# Patient Record
Sex: Male | Born: 1965 | Race: White | Hispanic: No | Marital: Married | Smoking: Never smoker
Health system: Southern US, Community
[De-identification: ages and names within clinical notes are randomized; demographics above are authoritative.]

## PROBLEM LIST (undated history)

## (undated) DIAGNOSIS — K219 Gastro-esophageal reflux disease without esophagitis: Secondary | ICD-10-CM

## (undated) DIAGNOSIS — I10 Essential (primary) hypertension: Secondary | ICD-10-CM

## (undated) HISTORY — PX: SCAR REVISION: SHX5285

## (undated) HISTORY — DX: Essential (primary) hypertension: I10

## (undated) HISTORY — DX: Gastro-esophageal reflux disease without esophagitis: K21.9

---

## 2012-06-25 ENCOUNTER — Encounter: Payer: Self-pay | Admitting: Physician Assistant

## 2012-06-25 ENCOUNTER — Ambulatory Visit (INDEPENDENT_AMBULATORY_CARE_PROVIDER_SITE_OTHER): Payer: BC Managed Care – PPO | Admitting: Physician Assistant

## 2012-06-25 VITALS — BP 129/84 | HR 82 | Temp 97.4°F | Wt 192.0 lb

## 2012-06-25 DIAGNOSIS — J329 Chronic sinusitis, unspecified: Secondary | ICD-10-CM

## 2012-06-25 DIAGNOSIS — I10 Essential (primary) hypertension: Secondary | ICD-10-CM

## 2012-06-25 MED ORDER — LORATADINE-PSEUDOEPHEDRINE ER 10-240 MG PO TB24: 1.0000 | ORAL_TABLET | Freq: Every day | ORAL | Status: DC

## 2012-06-25 MED ORDER — AMOXICILLIN-POT CLAVULANATE 875-125 MG PO TABS: 1.0000 | ORAL_TABLET | Freq: Two times a day (BID) | ORAL | Status: DC

## 2012-06-25 NOTE — Patient Instructions (Addendum)

## 2012-06-25 NOTE — Progress Notes (Signed)
Subjective:     Patient ID: Larry Patel, male   DOB: 01/16/1965, 47 y.o.   MRN: [id number]  HPI Pt with sinus pressure/congestion, cough, S/T, and general malaise for ~ 10 days Denies definite fever/chills Cough worse with laying He has used OTC meds with minimal relief  Review of Systems  All other systems reviewed and are negative.       Objective:   Physical Exam  Nursing note and vitals reviewed. ears- canals nl, TM's with fluid bilat Oral- thick PND, no  drainage or increase in tonsil size, no exudate No cerv nodes Heart- RRR w/o M Lungs- coarse sounds bilat that clear well with cough + maxillary and frontal sinus TTP     Assessment:     Sinusitis    Plan:     OTC Claritin D Fluids  Rest Augmentin 875mg  1 po bid with food x 10 days F/U prn

## 2015-02-22 DIAGNOSIS — K219 Gastro-esophageal reflux disease without esophagitis: Secondary | ICD-10-CM | POA: Insufficient documentation

## 2015-07-27 ENCOUNTER — Telehealth: Payer: Self-pay | Admitting: Family Medicine

## 2015-07-27 NOTE — Telephone Encounter (Signed)
Pt given appt with Dr.Bradshaw tomorrow at 3:10 and is aware to arrive 30 minutes prior.

## 2015-07-28 ENCOUNTER — Encounter: Payer: Self-pay | Admitting: Family Medicine

## 2015-07-28 ENCOUNTER — Encounter (INDEPENDENT_AMBULATORY_CARE_PROVIDER_SITE_OTHER): Payer: Self-pay

## 2015-07-28 ENCOUNTER — Telehealth: Payer: Self-pay | Admitting: Family Medicine

## 2015-07-28 ENCOUNTER — Other Ambulatory Visit: Payer: Self-pay | Admitting: *Deleted

## 2015-07-28 ENCOUNTER — Ambulatory Visit (INDEPENDENT_AMBULATORY_CARE_PROVIDER_SITE_OTHER): Payer: BLUE CROSS/BLUE SHIELD | Admitting: Family Medicine

## 2015-07-28 VITALS — BP 141/83 | HR 75 | Temp 97.2°F | Ht 69.0 in | Wt 204.8 lb

## 2015-07-28 DIAGNOSIS — I10 Essential (primary) hypertension: Secondary | ICD-10-CM | POA: Diagnosis not present

## 2015-07-28 DIAGNOSIS — K219 Gastro-esophageal reflux disease without esophagitis: Secondary | ICD-10-CM

## 2015-07-28 MED ORDER — OMEPRAZOLE 40 MG PO CPDR: 40.0000 mg | DELAYED_RELEASE_CAPSULE | Freq: Every day | ORAL | 3 refills | Status: DC

## 2015-07-28 MED ORDER — VALSARTAN 320 MG PO TABS: 320.0000 mg | ORAL_TABLET | Freq: Every day | ORAL | 1 refills | Status: DC

## 2015-07-28 MED ORDER — VALSARTAN 320 MG PO TABS: 160.0000 mg | ORAL_TABLET | Freq: Every day | ORAL | 1 refills | Status: DC

## 2015-07-28 NOTE — Patient Instructions (Signed)
Great to meet you!  Lets plan on seeing you once a year unless you need [place] sooner.   We will call with labs or send them to mychart within 1 week

## 2015-07-28 NOTE — Telephone Encounter (Signed)
Pt takes valsartan 320 mg 0.5 tablets daily. Corrected Rx sent to [facility]

## 2015-07-28 NOTE — Progress Notes (Signed)
   HPI  Patient presents today here to establish care and discuss hypertension.  Patient is previously going to [facility] family practice, he states that he would like to transition here because his wife comes to see me as well.  Hypertension He states he takes 160 mg valsartan every day He takes half a tablet of 320 No headache, chest pain, dyspnea, palpitations, leg edema. Good medication compliance. Blood pressure is typically 130/80 at home. He has been out of medication for 1-1/2 weeks.  He exercises running 3-4 miles 3 or more times a week. He watches his diet moderately, his wife has diabetes.  PMH: Smoking status noted Past medical history significant for hypertension Family history of diabetes in a maternal aunt, hypertension in father Only skin surgery Former smoker, quit in 1998 Approximate 2 drinks per week. ROS: Per HPI  Objective: BP (!) 141/83   Pulse 75   Temp 97.2 F (36.2 C) (Oral)   Ht '5\' 9"'$  (1.753 m)   Wt 204 lb 12.8 oz (92.9 kg)   BMI 30.24 kg/m  Gen: NAD, alert, cooperative with exam HEENT: NCAT, EOMI, PERRL CV: RRR, good S1/S2, no murmur Resp: CTABL, no wheezes, non-labored Abd: SNTND, BS present, no guarding or organomegaly Ext: No edema, warm Neuro: Alert and oriented, strength 5/5 and sensation intact in all 4 extremities, 2+ patellar tendon reflexes bilaterally  Assessment and plan:  # Hypertension He's been out of medication for 1-1/2 weeks, elevated today. Reports good control at home. Discussed therapeutic lifestyle changes encouraged his exercise habits and improving his diet.  He is not fasting today, labs  Obesity discussed, he is very muscular, more likely overweight  Gerd- refill PPI    Orders Placed This Encounter  Procedures  . CMP14+EGFR  . CBC with Differential  . Lipid Panel  . TSH    Meds ordered this encounter  Medications  . DISCONTD: valsartan (DIOVAN) 320 MG tablet    Sig: Take 1 tablet by mouth daily.  Take 1/2 tablet daily  . DISCONTD: omeprazole (PRILOSEC) 40 MG capsule    Sig: Take 40 mg by mouth daily.  . valsartan (DIOVAN) 320 MG tablet    Sig: Take 1 tablet (320 mg total) by mouth daily. Take 1/2 tablet daily    Dispense:  90 tablet    Refill:  1  . omeprazole (PRILOSEC) 40 MG capsule    Sig: Take 1 capsule (40 mg total) by mouth daily.    Dispense:  90 capsule    Refill:  [place], MD [facility] 07/28/2015, 3:24 PM

## 2015-07-29 LAB — CMP14+EGFR
A/G RATIO: 1.6 (ref 1.2–2.2)
ALBUMIN: 4.4 g/dL (ref 3.5–5.5)
ALK PHOS: 67 IU/L (ref 39–117)
ALT: 22 IU/L (ref 0–44)
AST: 21 IU/L (ref 0–40)
BILIRUBIN TOTAL: 0.2 mg/dL (ref 0.0–1.2)
BUN / CREAT RATIO: 20 (ref 9–20)
BUN: 16 mg/dL (ref 6–24)
CHLORIDE: 102 mmol/L (ref 96–106)
CO2: 23 mmol/L (ref 18–29)
Calcium: 8.9 mg/dL (ref 8.7–10.2)
Creatinine, Ser: 0.8 mg/dL (ref 0.76–1.27)
GFR calc non Af Amer: 106 mL/min/{1.73_m2} (ref >59)
GFR, EST AFRICAN AMERICAN: 123 mL/min/{1.73_m2} (ref >59)
GLUCOSE: 117 mg/dL — AB (ref 65–99)
Globulin, Total: 2.7 g/dL (ref 1.5–4.5)
POTASSIUM: 4.3 mmol/L (ref 3.5–5.2)
Sodium: 140 mmol/L (ref 134–144)
TOTAL PROTEIN: 7.1 g/dL (ref 6.0–8.5)

## 2015-07-29 LAB — CBC WITH DIFFERENTIAL/PLATELET
BASOS ABS: 0 10*3/uL (ref 0.0–0.2)
Basos: 0 %
EOS (ABSOLUTE): 0.2 10*3/uL (ref 0.0–0.4)
Eos: 2 %
Hematocrit: 41.9 % (ref 37.5–51.0)
Hemoglobin: 14.5 g/dL (ref 12.6–17.7)
Immature Grans (Abs): 0 10*3/uL (ref 0.0–0.1)
Immature Granulocytes: 0 %
LYMPHS ABS: 2.2 10*3/uL (ref 0.7–3.1)
Lymphs: 32 %
MCH: 32.1 pg (ref 26.6–33.0)
MCHC: 34.6 g/dL (ref 31.5–35.7)
MCV: 93 fL (ref 79–97)
Monocytes Absolute: 0.4 10*3/uL (ref 0.1–0.9)
Monocytes: 6 %
NEUTROS ABS: 4.1 10*3/uL (ref 1.4–7.0)
Neutrophils: 60 %
PLATELETS: 211 10*3/uL (ref 150–379)
RBC: 4.52 x10E6/uL (ref 4.14–5.80)
RDW: 14 % (ref 12.3–15.4)
WBC: 6.8 10*3/uL (ref 3.4–10.8)

## 2015-07-29 LAB — LIPID PANEL
CHOLESTEROL TOTAL: 179 mg/dL (ref 100–199)
Chol/HDL Ratio: 2.6 ratio units (ref 0.0–5.0)
HDL: 70 mg/dL (ref >39)
LDL Calculated: 74 mg/dL (ref 0–99)
Triglycerides: 177 mg/dL — ABNORMAL HIGH (ref 0–149)
VLDL Cholesterol Cal: 35 mg/dL (ref 5–40)

## 2015-07-29 LAB — TSH: TSH: 2.29 u[IU]/mL (ref 0.450–4.500)

## 2015-08-03 ENCOUNTER — Telehealth: Payer: Self-pay | Admitting: Family Medicine

## 2015-08-03 NOTE — Telephone Encounter (Signed)
Left detailed message per patients request about lab results.

## 2016-07-19 ENCOUNTER — Other Ambulatory Visit: Payer: Self-pay | Admitting: Family Medicine

## 2016-07-24 ENCOUNTER — Other Ambulatory Visit: Payer: Self-pay | Admitting: Family Medicine

## 2016-07-31 ENCOUNTER — Other Ambulatory Visit: Payer: Self-pay

## 2016-07-31 MED ORDER — OMEPRAZOLE 40 MG PO CPDR: 40.0000 mg | DELAYED_RELEASE_CAPSULE | Freq: Every day | ORAL | 0 refills | Status: DC

## 2016-07-31 MED ORDER — OLMESARTAN MEDOXOMIL 40 MG PO TABS: 40.0000 mg | ORAL_TABLET | Freq: Every day | ORAL | 0 refills | Status: DC

## 2016-07-31 NOTE — Telephone Encounter (Signed)
Last seen 07/28/15- please advise refill.

## 2016-07-31 NOTE — Telephone Encounter (Signed)
Lmtcb/ww 07/31

## 2016-07-31 NOTE — Telephone Encounter (Signed)
Change valsartan to olmesartan due to recall, needs follow up   Has had labs at cardiology recently.   Murtis SinkSam Bradshaw, MD [facility] 07/31/2016, 1:48 PM

## 2016-08-04 NOTE — Telephone Encounter (Signed)
Patient aware of medication change and that he is time for a follow up

## 2016-11-02 ENCOUNTER — Other Ambulatory Visit: Payer: Self-pay | Admitting: Family Medicine

## 2016-11-05 ENCOUNTER — Encounter: Payer: Self-pay | Admitting: Family Medicine

## 2016-11-05 ENCOUNTER — Ambulatory Visit (INDEPENDENT_AMBULATORY_CARE_PROVIDER_SITE_OTHER): Payer: BLUE CROSS/BLUE SHIELD | Admitting: Family Medicine

## 2016-11-05 VITALS — BP 137/81 | HR 83 | Temp 97.4°F | Ht 69.0 in | Wt 205.0 lb

## 2016-11-05 DIAGNOSIS — Z23 Encounter for immunization: Secondary | ICD-10-CM

## 2016-11-05 DIAGNOSIS — K219 Gastro-esophageal reflux disease without esophagitis: Secondary | ICD-10-CM

## 2016-11-05 DIAGNOSIS — I10 Essential (primary) hypertension: Secondary | ICD-10-CM

## 2016-11-05 DIAGNOSIS — Z0000 Encounter for general adult medical examination without abnormal findings: Secondary | ICD-10-CM | POA: Diagnosis not present

## 2016-11-05 MED ORDER — OLMESARTAN MEDOXOMIL 40 MG PO TABS: 40.0000 mg | ORAL_TABLET | Freq: Every day | ORAL | 3 refills | Status: DC

## 2016-11-05 MED ORDER — OMEPRAZOLE 40 MG PO CPDR: 40.0000 mg | DELAYED_RELEASE_CAPSULE | Freq: Every day | ORAL | 3 refills | Status: DC

## 2016-11-05 NOTE — Progress Notes (Signed)
   HPI  Patient presents today for an annual physical exam and for routine follow-up.  Hypertension Patient changed from valsartan to losartan whenever the recall happened.  He stated he had about a week of lightheadedness and then improved.  He has had no other side effects. He is tolerating medication well with good medication compliance currently. Blood pressure at home has been generally 130s when checked. No chest pain or headaches.  GERD Daily symptoms without medication, needs refill of PPI.  He would like a flu shot today  PMH: Smoking status noted ROS: Per HPI  Objective: BP 137/81   Pulse 83   Temp (!) 97.4 F (36.3 C) (Oral)   Ht _0  (1.753 m)   Wt 205 lb (93 kg)   BMI 30.27 kg/m  Gen: NAD, alert, cooperative with exam HEENT: NCAT, EOMI, PERRL CV: RRR, good S1/S2, no murmur Resp: CTABL, no wheezes, non-labored Abd: SNTND, BS present, no guarding or organomegaly Ext: No edema, warm Neuro: Alert and oriented, No gross deficits  Assessment and plan:  #Annual physical exam Normal exam Discussed prostate health and prostate issues. Discussed colonoscopy next year. Influenza vaccine given  #Hypertension Well-controlled on losartan, refill Labs  #GERD Refill PPI, well controlled   Orders Placed This Encounter  Procedures  . PSA  . CBC with Differential/Platelet  . CMP14+EGFR  . Lipid panel    Meds ordered this encounter  Medications  . olmesartan (BENICAR) 40 MG tablet    Sig: Take 1 tablet (40 mg total) daily by mouth.    Dispense:  90 tablet    Refill:  3  . omeprazole (PRILOSEC) 40 MG capsule    Sig: Take 1 capsule (40 mg total) daily by mouth.    Dispense:  90 capsule    Refill:  [place], MD [facility] 11/05/2016, 11:06 AM

## 2016-11-05 NOTE — Patient Instructions (Signed)
Great to see you!   Health Maintenance, Male A healthy lifestyle and preventive care is important for your health and wellness. Ask your health care provider about what schedule of regular examinations is right for you. What should I know about weight and diet? Eat a Healthy Diet  Eat plenty of vegetables, fruits, whole grains, low-fat dairy products, and lean protein.  Do not eat a lot of foods high in solid fats, added sugars, or salt.  Maintain a Healthy Weight Regular exercise can help you achieve or maintain a healthy weight. You should:  Do at least 150 minutes of exercise each week. The exercise should increase your heart rate and make you sweat (moderate-intensity exercise).  Do strength-training exercises at least twice a week.  Watch Your Levels of Cholesterol and Blood Lipids  Have your blood tested for lipids and cholesterol every 5 years starting at 49 years of age. If you are at high risk for heart disease, you should start having your blood tested when you are 49 years old. You may need to have your cholesterol levels checked more often if: ? Your lipid or cholesterol levels are high. ? You are older than 50 years of age. ? You are at high risk for heart disease.  What should I know about cancer screening? Many types of cancers can be detected early and may often be prevented. Lung Cancer  You should be screened every year for lung cancer if: ? You are a current smoker who has smoked for at least 30 years. ? You are a former smoker who has quit within the past 15 years.  Talk to your health care provider about your screening options, when you should start screening, and how often you should be screened.  Colorectal Cancer  Routine colorectal cancer screening usually begins at 50 years of age and should be repeated every 5-10 years until you are 49 years old. You may need to be screened more often if early forms of precancerous polyps or small growths are found.  Your health care provider may recommend screening at an earlier age if you have risk factors for colon cancer.  Your health care provider may recommend using home test kits to check for hidden blood in the stool.  A small camera at the end of a tube can be used to examine your colon (sigmoidoscopy or colonoscopy). This checks for the earliest forms of colorectal cancer.  Prostate and Testicular Cancer  Depending on your age and overall health, your health care provider may do certain tests to screen for prostate and testicular cancer.  Talk to your health care provider about any symptoms or concerns you have about testicular or prostate cancer.  Skin Cancer  Check your skin from head to toe regularly.  Tell your health care provider about any new moles or changes in moles, especially if: ? There is a change in a mole's size, shape, or color. ? You have a mole that is larger than a pencil eraser.  Always use sunscreen. Apply sunscreen liberally and repeat throughout the day.  Protect yourself by wearing long sleeves, pants, a wide-brimmed hat, and sunglasses when outside.  What should I know about heart disease, diabetes, and high blood pressure?  If you are 18-39 years of age, have your blood pressure checked every 3-5 years. If you are 40 years of age or older, have your blood pressure checked every year. You should have your blood pressure measured twice-once when you are at a   hospital or clinic, and once when you are not at a hospital or clinic. Record the average of the two measurements. To check your blood pressure when you are not at a hospital or clinic, you can use: ? An automated blood pressure machine at a pharmacy. ? A home blood pressure monitor.  Talk to your health care provider about your target blood pressure.  If you are between 45-79 years old, ask your health care provider if you should take aspirin to prevent heart disease.  Have regular diabetes screenings by  checking your fasting blood sugar level. ? If you are at a normal weight and have a low risk for diabetes, have this test once every three years after the age of 45. ? If you are overweight and have a high risk for diabetes, consider being tested at a younger age or more often.  A one-time screening for abdominal aortic aneurysm (AAA) by ultrasound is recommended for men aged 65-75 years who are current or former smokers. What should I know about preventing infection? Hepatitis B If you have a higher risk for hepatitis B, you should be screened for this virus. Talk with your health care provider to find out if you are at risk for hepatitis B infection. Hepatitis C Blood testing is recommended for:  Everyone born from 1945 through 1965.  Anyone with known risk factors for hepatitis C.  Sexually Transmitted Diseases (STDs)  You should be screened each year for STDs including gonorrhea and chlamydia if: ? You are sexually active and are younger than 49 years of age. ? You are older than 49 years of age and your health care provider tells you that you are at risk for this type of infection. ? Your sexual activity has changed since you were last screened and you are at an increased risk for chlamydia or gonorrhea. Ask your health care provider if you are at risk.  Talk with your health care provider about whether you are at high risk of being infected with HIV. Your health care provider may recommend a prescription medicine to help prevent HIV infection.  What else can I do?  Schedule regular health, dental, and eye exams.  Stay current with your vaccines (immunizations).  Do not use any tobacco products, such as cigarettes, chewing tobacco, and e-cigarettes. If you need help quitting, ask your health care provider.  Limit alcohol intake to no more than 2 drinks per day. One drink equals 12 ounces of beer, 5 ounces of wine, or 1 ounces of hard liquor.  Do not use street drugs.  Do not  share needles.  Ask your health care provider for help if you need support or information about quitting drugs.  Tell your health care provider if you often feel depressed.  Tell your health care provider if you have ever been abused or do not feel safe at home. This information is not intended to replace advice given to you by your health care provider. Make sure you discuss any questions you have with your health care provider. Document Released: 06/16/2007 Document Revised: 08/17/2015 Document Reviewed: 09/21/2014 Elsevier Interactive Patient Education  2018 [employer]  

## 2016-11-06 ENCOUNTER — Encounter: Payer: Self-pay | Admitting: Family Medicine

## 2016-11-06 LAB — CMP14+EGFR
A/G RATIO: 1.4 (ref 1.2–2.2)
ALK PHOS: 63 IU/L (ref 39–117)
ALT: 16 IU/L (ref 0–44)
AST: 12 IU/L (ref 0–40)
Albumin: 4.3 g/dL (ref 3.5–5.5)
BUN/Creatinine Ratio: 11 (ref 9–20)
BUN: 11 mg/dL (ref 6–24)
Bilirubin Total: 0.4 mg/dL (ref 0.0–1.2)
CALCIUM: 9.4 mg/dL (ref 8.7–10.2)
CO2: 25 mmol/L (ref 20–29)
Chloride: 102 mmol/L (ref 96–106)
Creatinine, Ser: 1.02 mg/dL (ref 0.76–1.27)
GFR calc Af Amer: 99 mL/min/{1.73_m2} (ref >59)
GFR, EST NON AFRICAN AMERICAN: 86 mL/min/{1.73_m2} (ref >59)
GLOBULIN, TOTAL: 3 g/dL (ref 1.5–4.5)
Glucose: 92 mg/dL (ref 65–99)
POTASSIUM: 4.9 mmol/L (ref 3.5–5.2)
SODIUM: 141 mmol/L (ref 134–144)
Total Protein: 7.3 g/dL (ref 6.0–8.5)

## 2016-11-06 LAB — LIPID PANEL
Chol/HDL Ratio: 2.5 ratio (ref 0.0–5.0)
Cholesterol, Total: 183 mg/dL (ref 100–199)
HDL: 73 mg/dL (ref >39)
LDL Calculated: 98 mg/dL (ref 0–99)
TRIGLYCERIDES: 62 mg/dL (ref 0–149)
VLDL Cholesterol Cal: 12 mg/dL (ref 5–40)

## 2016-11-06 LAB — CBC WITH DIFFERENTIAL/PLATELET
BASOS: 0 %
Basophils Absolute: 0 10*3/uL (ref 0.0–0.2)
EOS (ABSOLUTE): 0.2 10*3/uL (ref 0.0–0.4)
EOS: 3 %
HEMATOCRIT: 42.1 % (ref 37.5–51.0)
Hemoglobin: 14.4 g/dL (ref 13.0–17.7)
Immature Grans (Abs): 0 10*3/uL (ref 0.0–0.1)
Immature Granulocytes: 0 %
LYMPHS ABS: 2.1 10*3/uL (ref 0.7–3.1)
Lymphs: 33 %
MCH: 31.9 pg (ref 26.6–33.0)
MCHC: 34.2 g/dL (ref 31.5–35.7)
MCV: 93 fL (ref 79–97)
MONOS ABS: 0.4 10*3/uL (ref 0.1–0.9)
Monocytes: 6 %
Neutrophils Absolute: 3.7 10*3/uL (ref 1.4–7.0)
Neutrophils: 58 %
Platelets: 214 10*3/uL (ref 150–379)
RBC: 4.52 x10E6/uL (ref 4.14–5.80)
RDW: 14 % (ref 12.3–15.4)
WBC: 6.4 10*3/uL (ref 3.4–10.8)

## 2016-11-06 LAB — PSA: Prostate Specific Ag, Serum: 0.5 ng/mL (ref 0.0–4.0)

## 2017-01-31 ENCOUNTER — Other Ambulatory Visit: Payer: Self-pay | Admitting: Family Medicine

## 2017-02-03 ENCOUNTER — Other Ambulatory Visit: Payer: Self-pay | Admitting: Family Medicine

## 2017-04-29 ENCOUNTER — Other Ambulatory Visit: Payer: Self-pay | Admitting: Family Medicine

## 2017-04-29 NOTE — Telephone Encounter (Signed)
Last seen 11/05/16

## 2017-05-06 ENCOUNTER — Other Ambulatory Visit: Payer: Self-pay | Admitting: Family Medicine

## 2017-07-18 ENCOUNTER — Other Ambulatory Visit: Payer: Self-pay | Admitting: Family Medicine

## 2017-07-18 NOTE — Telephone Encounter (Signed)
Last seen 11/05/16

## 2017-07-22 ENCOUNTER — Other Ambulatory Visit: Payer: Self-pay | Admitting: Family Medicine

## 2017-07-23 NOTE — Telephone Encounter (Signed)
Last seen 11/05/16

## 2017-10-17 ENCOUNTER — Other Ambulatory Visit: Payer: Self-pay | Admitting: *Deleted

## 2017-10-17 MED ORDER — OLMESARTAN MEDOXOMIL 40 MG PO TABS: 40.0000 mg | ORAL_TABLET | Freq: Every day | ORAL | 0 refills | Status: DC

## 2017-11-05 ENCOUNTER — Ambulatory Visit: Payer: BLUE CROSS/BLUE SHIELD | Admitting: Family Medicine

## 2017-11-05 ENCOUNTER — Encounter: Payer: Self-pay | Admitting: Family Medicine

## 2017-11-05 VITALS — BP 146/94 | HR 76 | Temp 97.0°F | Ht 69.0 in | Wt 208.4 lb

## 2017-11-05 DIAGNOSIS — K219 Gastro-esophageal reflux disease without esophagitis: Secondary | ICD-10-CM

## 2017-11-05 DIAGNOSIS — Z6830 Body mass index (BMI) 30.0-30.9, adult: Secondary | ICD-10-CM

## 2017-11-05 DIAGNOSIS — Z23 Encounter for immunization: Secondary | ICD-10-CM

## 2017-11-05 DIAGNOSIS — Z0000 Encounter for general adult medical examination without abnormal findings: Secondary | ICD-10-CM | POA: Diagnosis not present

## 2017-11-05 DIAGNOSIS — I10 Essential (primary) hypertension: Secondary | ICD-10-CM

## 2017-11-05 DIAGNOSIS — Z1211 Encounter for screening for malignant neoplasm of colon: Secondary | ICD-10-CM

## 2017-11-05 MED ORDER — OMEPRAZOLE 40 MG PO CPDR: 40.0000 mg | DELAYED_RELEASE_CAPSULE | Freq: Every day | ORAL | 3 refills | Status: DC

## 2017-11-05 MED ORDER — OLMESARTAN MEDOXOMIL 40 MG PO TABS: 40.0000 mg | ORAL_TABLET | Freq: Every day | ORAL | 0 refills | Status: DC

## 2017-11-05 MED ORDER — OLMESARTAN MEDOXOMIL 40 MG PO TABS: 40.0000 mg | ORAL_TABLET | Freq: Every day | ORAL | 6 refills | Status: DC

## 2017-11-05 MED ORDER — OMEPRAZOLE 40 MG PO CPDR: 40.0000 mg | DELAYED_RELEASE_CAPSULE | Freq: Every day | ORAL | 0 refills | Status: DC

## 2017-11-05 NOTE — Addendum Note (Signed)
Addended by: Sonny Masters on: 11/05/2017 05:35 PM   Modules accepted: Orders

## 2017-11-05 NOTE — Patient Instructions (Signed)
DASH Eating Plan DASH stands for "Dietary Approaches to Stop Hypertension." The DASH eating plan is a healthy eating plan that has been shown to reduce high blood pressure (hypertension). It may also reduce your risk for type 2 diabetes, heart disease, and stroke. The DASH eating plan may also help with weight loss. What are tips for following this plan? General guidelines  Avoid eating more than 2,300 mg (milligrams) of salt (sodium) a day. If you have hypertension, you may need to reduce your sodium intake to 1,500 mg a day.  Limit alcohol intake to no more than 1 drink a day for nonpregnant women and 2 drinks a day for men. One drink equals 12 oz of beer, 5 oz of wine, or 1 oz of hard liquor.  Work with your health care provider to maintain a healthy body weight or to lose weight. Ask what an ideal weight is for you.  Get at least 30 minutes of exercise that causes your heart to beat faster (aerobic exercise) most days of the week. Activities may include walking, swimming, or biking.  Work with your health care provider or diet and nutrition specialist (dietitian) to adjust your eating plan to your individual calorie needs. Reading food labels  Check food labels for the amount of sodium per serving. Choose foods with less than 5 percent of the Daily Value of sodium. Generally, foods with less than 300 mg of sodium per serving fit into this eating plan.  To find whole grains, look for the word "whole" as the first word in the ingredient list. Shopping  Buy products labeled as "low-sodium" or "no salt added."  Buy fresh foods. Avoid canned foods and premade or frozen meals. Cooking  Avoid adding salt when cooking. Use salt-free seasonings or herbs instead of table salt or sea salt. Check with your health care provider or pharmacist before using salt substitutes.  Do not fry foods. Cook foods using healthy methods such as baking, boiling, grilling, and broiling instead.  Cook with  heart-healthy oils, such as olive, canola, soybean, or sunflower oil. Meal planning   Eat a balanced diet that includes: ? 5 or more servings of fruits and vegetables each day. At each meal, try to fill half of your plate with fruits and vegetables. ? Up to 6-8 servings of whole grains each day. ? Less than 6 oz of lean meat, poultry, or fish each day. A 3-oz serving of meat is about the same size as a deck of cards. One egg equals 1 oz. ? 2 servings of low-fat dairy each day. ? A serving of nuts, seeds, or beans 5 times each week. ? Heart-healthy fats. Healthy fats called Omega-3 fatty acids are found in foods such as flaxseeds and coldwater fish, like sardines, salmon, and mackerel.  Limit how much you eat of the following: ? Canned or prepackaged foods. ? Food that is high in trans fat, such as fried foods. ? Food that is high in saturated fat, such as fatty meat. ? Sweets, desserts, sugary drinks, and other foods with added sugar. ? Full-fat dairy products.  Do not salt foods before eating.  Try to eat at least 2 vegetarian meals each week.  Eat more home-cooked food and less restaurant, buffet, and fast food.  When eating at a restaurant, ask that your food be prepared with less salt or no salt, if possible. What foods are recommended? The items listed may not be a complete list. Talk with your dietitian about what   dietary choices are best for you. Grains Whole-grain or whole-wheat bread. Whole-grain or whole-wheat pasta. Brown rice. Oatmeal. Quinoa. Bulgur. Whole-grain and low-sodium cereals. Pita bread. Low-fat, low-sodium crackers. Whole-wheat flour tortillas. Vegetables Fresh or frozen vegetables (raw, steamed, roasted, or grilled). Low-sodium or reduced-sodium tomato and vegetable juice. Low-sodium or reduced-sodium tomato sauce and tomato paste. Low-sodium or reduced-sodium canned vegetables. Fruits All fresh, dried, or frozen fruit. Canned fruit in natural juice (without  added sugar). Meat and other protein foods Skinless chicken or turkey. Ground chicken or turkey. Pork with fat trimmed off. Fish and seafood. Egg whites. Dried beans, peas, or lentils. Unsalted nuts, nut butters, and seeds. Unsalted canned beans. Lean cuts of beef with fat trimmed off. Low-sodium, lean deli meat. Dairy Low-fat (1%) or fat-free (skim) milk. Fat-free, low-fat, or reduced-fat cheeses. Nonfat, low-sodium ricotta or cottage cheese. Low-fat or nonfat yogurt. Low-fat, low-sodium cheese. Fats and oils Soft margarine without trans fats. Vegetable oil. Low-fat, reduced-fat, or light mayonnaise and salad dressings (reduced-sodium). Canola, safflower, olive, soybean, and sunflower oils. Avocado. Seasoning and other foods Herbs. Spices. Seasoning mixes without salt. Unsalted popcorn and pretzels. Fat-free sweets. What foods are not recommended? The items listed may not be a complete list. Talk with your dietitian about what dietary choices are best for you. Grains Baked goods made with fat, such as croissants, muffins, or some breads. Dry pasta or rice meal packs. Vegetables Creamed or fried vegetables. Vegetables in a cheese sauce. Regular canned vegetables (not low-sodium or reduced-sodium). Regular canned tomato sauce and paste (not low-sodium or reduced-sodium). Regular tomato and vegetable juice (not low-sodium or reduced-sodium). Pickles. Olives. Fruits Canned fruit in a light or heavy syrup. Fried fruit. Fruit in cream or butter sauce. Meat and other protein foods Fatty cuts of meat. Ribs. Fried meat. Bacon. Sausage. Bologna and other processed lunch meats. Salami. Fatback. Hotdogs. Bratwurst. Salted nuts and seeds. Canned beans with added salt. Canned or smoked fish. Whole eggs or egg yolks. Chicken or turkey with skin. Dairy Whole or 2% milk, cream, and half-and-half. Whole or full-fat cream cheese. Whole-fat or sweetened yogurt. Full-fat cheese. Nondairy creamers. Whipped toppings.  Processed cheese and cheese spreads. Fats and oils Butter. Stick margarine. Lard. Shortening. Ghee. Bacon fat. Tropical oils, such as coconut, palm kernel, or palm oil. Seasoning and other foods Salted popcorn and pretzels. Onion salt, garlic salt, seasoned salt, table salt, and sea salt. Worcestershire sauce. Tartar sauce. Barbecue sauce. Teriyaki sauce. Soy sauce, including reduced-sodium. Steak sauce. Canned and packaged gravies. Fish sauce. Oyster sauce. Cocktail sauce. Horseradish that you find on the shelf. Ketchup. Mustard. Meat flavorings and tenderizers. Bouillon cubes. Hot sauce and Tabasco sauce. Premade or packaged marinades. Premade or packaged taco seasonings. Relishes. Regular salad dressings. Where to find more information:  National Heart, Lung, and Blood Institute: www.nhlbi.nih.gov  American Heart Association: www.heart.org Summary  The DASH eating plan is a healthy eating plan that has been shown to reduce high blood pressure (hypertension). It may also reduce your risk for type 2 diabetes, heart disease, and stroke.  With the DASH eating plan, you should limit salt (sodium) intake to 2,300 mg a day. If you have hypertension, you may need to reduce your sodium intake to 1,500 mg a day.  When on the DASH eating plan, aim to eat more fresh fruits and vegetables, whole grains, lean proteins, low-fat dairy, and heart-healthy fats.  Work with your health care provider or diet and nutrition specialist (dietitian) to adjust your eating plan to your individual   calorie needs. This information is not intended to replace advice given to you by your health care provider. Make sure you discuss any questions you have with your health care provider. Document Released: 12/07/2010 Document Revised: 12/12/2015 Document Reviewed: 12/12/2015 Elsevier Interactive Patient Education  2018 [employer]  Health Maintenance, Male A healthy lifestyle and preventive care is important for your  health and wellness. Ask your health care provider about what schedule of regular examinations is right for you. What should I know about weight and diet? Eat a Healthy Diet  Eat plenty of vegetables, fruits, whole grains, low-fat dairy products, and lean protein.  Do not eat a lot of foods high in solid fats, added sugars, or salt.  Maintain a Healthy Weight Regular exercise can help you achieve or maintain a healthy weight. You should:  Do at least 150 minutes of exercise each week. The exercise should increase your heart rate and make you sweat (moderate-intensity exercise).  Do strength-training exercises at least twice a week.  Watch Your Levels of Cholesterol and Blood Lipids  Have your blood tested for lipids and cholesterol every 5 years starting at 50 years of age. If you are at high risk for heart disease, you should start having your blood tested when you are 50 years old. You may need to have your cholesterol levels checked more often if: ? Your lipid or cholesterol levels are high. ? You are older than 50 years of age. ? You are at high risk for heart disease.  What should I know about cancer screening? Many types of cancers can be detected early and may often be prevented. Lung Cancer  You should be screened every year for lung cancer if: ? You are a current smoker who has smoked for at least 30 years. ? You are a former smoker who has quit within the past 15 years.  Talk to your health care provider about your screening options, when you should start screening, and how often you should be screened.  Colorectal Cancer  Routine colorectal cancer screening usually begins at 50 years of age and should be repeated every 5-10 years until you are 50 years old. You may need to be screened more often if early forms of precancerous polyps or small growths are found. Your health care provider may recommend screening at an earlier age if you have risk factors for colon  cancer.  Your health care provider may recommend using home test kits to check for hidden blood in the stool.  A small camera at the end of a tube can be used to examine your colon (sigmoidoscopy or colonoscopy). This checks for the earliest forms of colorectal cancer.  Prostate and Testicular Cancer  Depending on your age and overall health, your health care provider may do certain tests to screen for prostate and testicular cancer.  Talk to your health care provider about any symptoms or concerns you have about testicular or prostate cancer.  Skin Cancer  Check your skin from head to toe regularly.  Tell your health care provider about any new moles or changes in moles, especially if: ? There is a change in a mole's size, shape, or color. ? You have a mole that is larger than a pencil eraser.  Always use sunscreen. Apply sunscreen liberally and repeat throughout the day.  Protect yourself by wearing long sleeves, pants, a wide-brimmed hat, and sunglasses when outside.  What should I know about heart disease, diabetes, and high blood pressure?    If you are 18-39 years of age, have your blood pressure checked every 3-5 years. If you are 40 years of age or older, have your blood pressure checked every year. You should have your blood pressure measured twice-once when you are at a hospital or clinic, and once when you are not at a hospital or clinic. Record the average of the two measurements. To check your blood pressure when you are not at a hospital or clinic, you can use: ? An automated blood pressure machine at a pharmacy. ? A home blood pressure monitor.  Talk to your health care provider about your target blood pressure.  If you are between 45-79 years old, ask your health care provider if you should take aspirin to prevent heart disease.  Have regular diabetes screenings by checking your fasting blood sugar level. ? If you are at a normal weight and have a low risk for  diabetes, have this test once every three years after the age of 45. ? If you are overweight and have a high risk for diabetes, consider being tested at a younger age or more often.  A one-time screening for abdominal aortic aneurysm (AAA) by ultrasound is recommended for men aged 65-75 years who are current or former smokers. What should I know about preventing infection? Hepatitis B If you have a higher risk for hepatitis B, you should be screened for this virus. Talk with your health care provider to find out if you are at risk for hepatitis B infection. Hepatitis C Blood testing is recommended for:  Everyone born from 1945 through 1965.  Anyone with known risk factors for hepatitis C.  Sexually Transmitted Diseases (STDs)  You should be screened each year for STDs including gonorrhea and chlamydia if: ? You are sexually active and are younger than 50 years of age. ? You are older than 50 years of age and your health care provider tells you that you are at risk for this type of infection. ? Your sexual activity has changed since you were last screened and you are at an increased risk for chlamydia or gonorrhea. Ask your health care provider if you are at risk.  Talk with your health care provider about whether you are at high risk of being infected with HIV. Your health care provider may recommend a prescription medicine to help prevent HIV infection.  What else can I do?  Schedule regular health, dental, and eye exams.  Stay current with your vaccines (immunizations).  Do not use any tobacco products, such as cigarettes, chewing tobacco, and e-cigarettes. If you need help quitting, ask your health care provider.  Limit alcohol intake to no more than 2 drinks per day. One drink equals 12 ounces of beer, 5 ounces of wine, or 1 ounces of hard liquor.  Do not use street drugs.  Do not share needles.  Ask your health care provider for help if you need support or information about  quitting drugs.  Tell your health care provider if you often feel depressed.  Tell your health care provider if you have ever been abused or do not feel safe at home. This information is not intended to replace advice given to you by your health care provider. Make sure you discuss any questions you have with your health care provider. Document Released: 06/16/2007 Document Revised: 08/17/2015 Document Reviewed: 09/21/2014 [employer] Interactive Patient Education  2018 [employer]  

## 2017-11-05 NOTE — Progress Notes (Signed)
Subjective:    Patient ID: Larry Patel, male    DOB: 08-06-1965, 52 y.o.   MRN: [id number]  Chief Complaint:  Annual Exam (out of blood pressure medication x 1 week)   HPI: Larry Patel is a 52 y.o. male presenting on 11/05/2017 for Annual Exam (out of blood pressure medication x 1 week)   1. Annual physical exam  Doing well overall. States he has intermittent arthralgias. States this is usually after a hard day at work. States he manages the pain with motrin as needed. States this is not on a daily basis. Pt states he gets an occasional headache, denies dizziness, confusion, weakness, or visual changes with the headaches. Denies regular exercise and does not watch diet. 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   2. Essential hypertension  Complaint with meds - Yes, but has been out of medication for a week. Checking BP at home ranging around 130/80 Exercising Regularly - No Watching Salt intake - No Pertinent ROS:  Headache - Yes, intermittent without other associated symptoms Chest pain - No Dyspnea - No Palpitations - No LE edema - No  They report good compliance with medications and can restate their regimen by memory. No medication side effects   3. Gastroesophageal reflux disease without esophagitis  Taking PPI with good control of symptoms. States if he misses a dose he does notice symptoms. He does attempt to avoid triggering foods. Denies sore throat, raspy voice, or cough.   4. BMI 30.0-30.9,adult  Does not watch diet or exercise on a regular basis. Does not drink enough water.      Relevant past medical, surgical, family, and social history reviewed and updated as indicated.  Allergies and medications reviewed and updated.   Past Medical History:  Diagnosis Date  . GERD (gastroesophageal reflux disease)   . Hypertension     Past Surgical History:  Procedure  Laterality Date  . SCAR REVISION Right     Social History   Socioeconomic History  . Marital status: Married    Spouse name: Not on file  . Number of children: Not on file  . Years of education: Not on file  . Highest education level: Not on file  Occupational History  . Not on file  Social Needs  . Financial resource strain: Not on file  . Food insecurity:    Worry: Not on file    Inability: Not on file  . Transportation needs:    Medical: Not on file    Non-medical: Not on file  Tobacco Use  . Smoking status: Former Smoker    Types: Cigarettes    Last attempt to quit: 06/25/1996    Years since quitting: 21.3  . Smokeless tobacco: Never Used  Substance and Sexual Activity  . Alcohol use: Not Currently  . Drug use: No  . Sexual activity: Not on file  Lifestyle  . Physical activity:    Days per week: Not on file    Minutes per session: Not on file  . Stress: Not on file  Relationships  . Social connections:    Talks on phone: Not on file    Gets together: Not on file    Attends religious service: Not on file    Active member of club or organization: Not on file    Attends meetings of clubs or organizations: Not on file  Relationship status: Not on file  . Intimate partner violence:    Fear of current or ex partner: Not on file    Emotionally abused: Not on file    Physically abused: Not on file    Forced sexual activity: Not on file  Other Topics Concern  . Not on file  Social History Narrative  . Not on file    Outpatient Encounter Medications as of 11/05/2017  Medication Sig  . olmesartan (BENICAR) 40 MG tablet Take 1 tablet (40 mg total) by mouth daily. (Needs to be seen before next refill)  . omeprazole (PRILOSEC) 40 MG capsule Take 1 capsule (40 mg total) by mouth daily.  . [DISCONTINUED] olmesartan (BENICAR) 40 MG tablet Take 1 tablet (40 mg total) by mouth daily. (Needs to be seen before next refill)  . [DISCONTINUED] omeprazole (PRILOSEC) 40 MG  capsule TAKE 1 CAPSULE (40 MG TOTAL) DAILY BY MOUTH.   No facility-administered encounter medications on file as of 11/05/2017.     No Known Allergies  Review of Systems  Constitutional: Negative for chills, fatigue, fever and unexpected weight change.  HENT: Negative.   Eyes: Negative for photophobia and visual disturbance.  Respiratory: Negative for cough, chest tightness and shortness of breath.   Cardiovascular: Negative for chest pain, palpitations and leg swelling.  Gastrointestinal: Negative for abdominal pain, blood in stool, constipation, diarrhea, nausea and vomiting.  Genitourinary: Negative for decreased urine volume, difficulty urinating, dysuria, enuresis, frequency and urgency.  Musculoskeletal: Positive for arthralgias. Negative for gait problem and myalgias.  Skin: Negative.   Neurological: Positive for headaches. Negative for dizziness, seizures, syncope, facial asymmetry, speech difficulty, weakness, light-headedness and numbness.  Psychiatric/Behavioral: Negative for behavioral problems and confusion. The patient is not nervous/anxious.   All other systems reviewed and are negative.       Objective:    BP (!) 146/94   Pulse 76   Temp (!) 97 F (36.1 C) (Oral)   Ht '5\' 9"'  (1.753 m)   Wt 208 lb 6 oz (94.5 kg)   BMI 30.77 kg/m    Wt Readings from Last 3 Encounters:  11/05/17 208 lb 6 oz (94.5 kg)  11/05/16 205 lb (93 kg)  07/28/15 204 lb 12.8 oz (92.9 kg)    Physical Exam  Constitutional: He is oriented to person, place, and time. He appears well-developed and well-nourished. He is cooperative.  HENT:  Head: Normocephalic and atraumatic.  Right Ear: Hearing, tympanic membrane, external ear and ear canal normal.  Left Ear: Hearing, tympanic membrane, external ear and ear canal normal.  Nose: Nose normal.  Mouth/Throat: Uvula is midline, oropharynx is clear and moist and mucous membranes are normal.  Eyes: Pupils are equal, round, and reactive to light.  Conjunctivae, EOM and lids are normal.  Neck: Trachea normal, full passive range of motion without pain and phonation normal. Neck supple. No JVD present. Carotid bruit is not present. No thyroid mass and no thyromegaly present.  Cardiovascular: Normal rate, regular rhythm, S1 normal, S2 normal, normal heart sounds and intact distal pulses. PMI is not displaced. Exam reveals no gallop and no friction rub.  No murmur heard. Pulses:      Dorsalis pedis pulses are 2+ on the right side, and 2+ on the left side.       Posterior tibial pulses are 2+ on the right side, and 2+ on the left side.  Pulmonary/Chest: Effort normal and breath sounds normal. No respiratory distress.  Abdominal: Soft. Normal appearance and normal  aorta. There is no hepatosplenomegaly. There is no tenderness. There is no rigidity, no rebound, no guarding, no CVA tenderness, no tenderness at McBurney's point and negative Murphy's sign. No hernia.  Musculoskeletal: Normal range of motion. He exhibits no edema, tenderness or deformity.  Lymphadenopathy:    He has no cervical adenopathy.  Neurological: He is alert and oriented to person, place, and time. He has normal strength and normal reflexes. No cranial nerve deficit or sensory deficit. He displays a negative Romberg sign.  Skin: Skin is warm, dry and intact. Capillary refill takes less than 2 seconds.  Psychiatric: He has a normal mood and affect. His speech is normal and behavior is normal. Judgment and thought content normal.  Nursing note and vitals reviewed.   Results for orders placed or performed in visit on 11/05/16  PSA  Result Value Ref Range   Prostate Specific Ag, Serum 0.5 0.0 - 4.0 ng/mL  CBC with Differential/Platelet  Result Value Ref Range   WBC 6.4 3.4 - 10.8 x10E3/uL   RBC 4.52 4.14 - 5.80 x10E6/uL   Hemoglobin 14.4 13.0 - 17.7 g/dL   Hematocrit 42.1 37.5 - 51.0 %   MCV 93 79 - 97 fL   MCH 31.9 26.6 - 33.0 pg   MCHC 34.2 31.5 - 35.7 g/dL   RDW 14.0  12.3 - 15.4 %   Platelets 214 150 - 379 x10E3/uL   Neutrophils 58 Not Estab. %   Lymphs 33 Not Estab. %   Monocytes 6 Not Estab. %   Eos 3 Not Estab. %   Basos 0 Not Estab. %   Neutrophils Absolute 3.7 1.4 - 7.0 x10E3/uL   Lymphocytes Absolute 2.1 0.7 - 3.1 x10E3/uL   Monocytes Absolute 0.4 0.1 - 0.9 x10E3/uL   EOS (ABSOLUTE) 0.2 0.0 - 0.4 x10E3/uL   Basophils Absolute 0.0 0.0 - 0.2 x10E3/uL   Immature Granulocytes 0 Not Estab. %   Immature Grans (Abs) 0.0 0.0 - 0.1 x10E3/uL  CMP14+EGFR  Result Value Ref Range   Glucose 92 65 - 99 mg/dL   BUN 11 6 - 24 mg/dL   Creatinine, Ser 1.02 0.76 - 1.27 mg/dL   GFR calc non Af Amer 86 >59 mL/min/1.73   GFR calc Af Amer 99 >59 mL/min/1.73   BUN/Creatinine Ratio 11 9 - 20   Sodium 141 134 - 144 mmol/L   Potassium 4.9 3.5 - 5.2 mmol/L   Chloride 102 96 - 106 mmol/L   CO2 25 20 - 29 mmol/L   Calcium 9.4 8.7 - 10.2 mg/dL   Total Protein 7.3 6.0 - 8.5 g/dL   Albumin 4.3 3.5 - 5.5 g/dL   Globulin, Total 3.0 1.5 - 4.5 g/dL   Albumin/Globulin Ratio 1.4 1.2 - 2.2   Bilirubin Total 0.4 0.0 - 1.2 mg/dL   Alkaline Phosphatase 63 39 - 117 IU/L   AST 12 0 - 40 IU/L   ALT 16 0 - 44 IU/L  Lipid panel  Result Value Ref Range   Cholesterol, Total 183 100 - 199 mg/dL   Triglycerides 62 0 - 149 mg/dL   HDL 73 >39 mg/dL   VLDL Cholesterol Cal 12 5 - 40 mg/dL   LDL Calculated 98 0 - 99 mg/dL   Chol/HDL Ratio 2.5 0.0 - 5.0 ratio       Pertinent labs & imaging results that were available during my care of the patient were reviewed by me and considered in my medical decision making.  Assessment & Plan:  Larry Patel was seen today for annual exam.  Diagnoses and all orders for this visit:  Annual physical exam Health maintenance. Diet and exercise encouraged. Weight management. Healthy food choices discussed, increase water intake.  -     CMP14+EGFR -     CBC with Differential/Platelet -     Lipid panel -     Microalbumin / creatinine urine ratio -      PSA, total and free  Essential hypertension DASH diet, weight management. Exercise encouraged. Medications as prescribed.  -     CMP14+EGFR -     CBC with Differential/Platelet -     Lipid panel -     Microalbumin / creatinine urine ratio -     olmesartan (BENICAR) 40 MG tablet; Take 1 tablet (40 mg total) by mouth daily.  Gastroesophageal reflux disease without esophagitis Avoid triggers such as caffeine, alcohol, spicy foods, and tobacco. Medications as prescribed.  -     omeprazole (PRILOSEC) 40 MG capsule; Take 1 capsule (40 mg total) by mouth daily.      Continue all other maintenance medications.  Follow up plan: Return in about 6 months (around 05/06/2018), or if symptoms worsen or fail to improve.  Educational handout given for DASH diet, health maintenance.  The above assessment and management plan was discussed with the patient. The patient verbalized understanding of and has agreed to the management plan. Patient is aware to call the clinic if symptoms persist or worsen. Patient is aware when to return to the clinic for a follow-up visit. Patient educated on when it is appropriate to go to the emergency department.   Monia Pouch, FNP-C [facility] [phone]

## 2017-11-06 LAB — CBC WITH DIFFERENTIAL/PLATELET
BASOS ABS: 0.1 10*3/uL (ref 0.0–0.2)
Basos: 1 %
EOS (ABSOLUTE): 0.3 10*3/uL (ref 0.0–0.4)
Eos: 4 %
HEMOGLOBIN: 13.7 g/dL (ref 13.0–17.7)
Hematocrit: 40.1 % (ref 37.5–51.0)
IMMATURE GRANS (ABS): 0 10*3/uL (ref 0.0–0.1)
IMMATURE GRANULOCYTES: 0 %
LYMPHS: 34 %
Lymphocytes Absolute: 2.4 10*3/uL (ref 0.7–3.1)
MCH: 31.9 pg (ref 26.6–33.0)
MCHC: 34.2 g/dL (ref 31.5–35.7)
MCV: 94 fL (ref 79–97)
MONOCYTES: 6 %
Monocytes Absolute: 0.4 10*3/uL (ref 0.1–0.9)
NEUTROS PCT: 55 %
Neutrophils Absolute: 3.9 10*3/uL (ref 1.4–7.0)
PLATELETS: 231 10*3/uL (ref 150–450)
RBC: 4.29 x10E6/uL (ref 4.14–5.80)
RDW: 12.8 % (ref 12.3–15.4)
WBC: 7 10*3/uL (ref 3.4–10.8)

## 2017-11-06 LAB — LIPID PANEL
CHOLESTEROL TOTAL: 177 mg/dL (ref 100–199)
Chol/HDL Ratio: 2.3 ratio (ref 0.0–5.0)
HDL: 76 mg/dL (ref >39)
LDL Calculated: 83 mg/dL (ref 0–99)
Triglycerides: 88 mg/dL (ref 0–149)
VLDL CHOLESTEROL CAL: 18 mg/dL (ref 5–40)

## 2017-11-06 LAB — CMP14+EGFR
ALBUMIN: 4.1 g/dL (ref 3.5–5.5)
ALT: 17 IU/L (ref 0–44)
AST: 20 IU/L (ref 0–40)
Albumin/Globulin Ratio: 1.5 (ref 1.2–2.2)
Alkaline Phosphatase: 69 IU/L (ref 39–117)
BUN/Creatinine Ratio: 13 (ref 9–20)
BUN: 13 mg/dL (ref 6–24)
Bilirubin Total: <0.2 mg/dL (ref 0.0–1.2)
CALCIUM: 9.2 mg/dL (ref 8.7–10.2)
CO2: 23 mmol/L (ref 20–29)
Chloride: 102 mmol/L (ref 96–106)
Creatinine, Ser: 1.03 mg/dL (ref 0.76–1.27)
GFR calc Af Amer: 97 mL/min/{1.73_m2} (ref >59)
GFR, EST NON AFRICAN AMERICAN: 84 mL/min/{1.73_m2} (ref >59)
GLOBULIN, TOTAL: 2.8 g/dL (ref 1.5–4.5)
Glucose: 109 mg/dL — ABNORMAL HIGH (ref 65–99)
Potassium: 4.2 mmol/L (ref 3.5–5.2)
Sodium: 141 mmol/L (ref 134–144)
Total Protein: 6.9 g/dL (ref 6.0–8.5)

## 2017-11-06 LAB — MICROALBUMIN / CREATININE URINE RATIO
Creatinine, Urine: 159.3 mg/dL
Microalb/Creat Ratio: <1.9 mg/g creat (ref 0.0–30.0)
Microalbumin, Urine: <3 ug/mL

## 2017-11-06 LAB — PSA, TOTAL AND FREE
PSA FREE PCT: 35 %
PSA FREE: 0.14 ng/mL
Prostate Specific Ag, Serum: 0.4 ng/mL (ref 0.0–4.0)

## 2017-12-02 ENCOUNTER — Encounter: Payer: Self-pay | Admitting: Family Medicine

## 2017-12-02 ENCOUNTER — Ambulatory Visit: Payer: BLUE CROSS/BLUE SHIELD | Admitting: Family Medicine

## 2017-12-02 VITALS — BP 135/89 | HR 84 | Temp 98.4°F | Ht 69.0 in | Wt 207.0 lb

## 2017-12-02 DIAGNOSIS — J069 Acute upper respiratory infection, unspecified: Secondary | ICD-10-CM

## 2017-12-02 DIAGNOSIS — M25512 Pain in left shoulder: Secondary | ICD-10-CM | POA: Diagnosis not present

## 2017-12-02 MED ORDER — DICLOFENAC SODIUM 75 MG PO TBEC: 75.0000 mg | DELAYED_RELEASE_TABLET | Freq: Two times a day (BID) | ORAL | 0 refills | Status: DC | PRN

## 2017-12-02 MED ORDER — METHYLPREDNISOLONE ACETATE 80 MG/ML IJ SUSP
80.0000 mg | Freq: Once | INTRAMUSCULAR | Status: AC
  Administered 2017-12-02: 80 mg via INTRAMUSCULAR

## 2017-12-02 MED ORDER — GUAIFENESIN-CODEINE 100-10 MG/5ML PO SOLN: 5.0000 mL | Freq: Four times a day (QID) | ORAL | 0 refills | Status: DC | PRN

## 2017-12-02 NOTE — Progress Notes (Signed)
Subjective: CC: Cough PCP: Larry Patel, Larry L, MD UJW:JXBJYHPI:Larry Patel is a 52 y.o. male presenting to clinic today for:  1. Cough Patient reports a 2-day history of productive cough that he describes as yellow-green phlegm.  Denies any hemoptysis, wheeze, shortness of breath.  He has had subjective fevers, myalgia, chills and sore throat.  No associated nausea, vomiting or diarrhea.  He has had multiple sick contacts at work.  He has been using Mucinex, Alka-Seltzer and Motrin.  Motrin does seem to help somewhat symptoms.  No current tobacco use, he is a former tobacco smoker.  2.  Shoulder pain Patient reports a 4-day history of left-sided shoulder pain.  The pain radiates down the left arm into the elbow and causes numbness and tingling in his pointer finger.  He feels like he has worsening pain and some weakness with elevation of the left upper extremity.  Denies any preceding trauma but states that he did work all weekend and wonders if this caused the symptoms.  He has also used icy hot on the affected area with little improvement in symptoms.  Motrin as above.  ROS: Per HPI  No Known Allergies Past Medical History:  Diagnosis Date  . GERD (gastroesophageal reflux disease)   . Hypertension     Current Outpatient Medications:  .  olmesartan (BENICAR) 40 MG tablet, Take 1 tablet (40 mg total) by mouth daily. (Needs to be seen before next refill), Disp: 30 tablet, Rfl: 6 .  omeprazole (PRILOSEC) 40 MG capsule, Take 1 capsule (40 mg total) by mouth daily., Disp: 90 capsule, Rfl: 3 Social History   Socioeconomic History  . Marital status: Married    Spouse name: Not on file  . Number of children: Not on file  . Years of education: Not on file  . Highest education level: Not on file  Occupational History  . Not on file  Social Needs  . Financial resource strain: Not on file  . Food insecurity:    Worry: Not on file    Inability: Not on file  . Transportation needs:    Medical:  Not on file    Non-medical: Not on file  Tobacco Use  . Smoking status: Former Smoker    Types: Cigarettes    Last attempt to quit: 06/25/1996    Years since quitting: 21.4  . Smokeless tobacco: Never Used  Substance and Sexual Activity  . Alcohol use: Not Currently  . Drug use: No  . Sexual activity: Not on file  Lifestyle  . Physical activity:    Days per week: Not on file    Minutes per session: Not on file  . Stress: Not on file  Relationships  . Social connections:    Talks on phone: Not on file    Gets together: Not on file    Attends religious service: Not on file    Active member of club or organization: Not on file    Attends meetings of clubs or organizations: Not on file    Relationship status: Not on file  . Intimate partner violence:    Fear of current or ex partner: Not on file    Emotionally abused: Not on file    Physically abused: Not on file    Forced sexual activity: Not on file  Other Topics Concern  . Not on file  Social History Narrative  . Not on file   Family History  Problem Relation Age of Onset  . Healthy Mother   .  Hypertension Father   . Healthy Sister   . Diabetes Maternal Aunt     Objective: Office vital signs reviewed. BP 135/89   Pulse 84   Temp 98.4 F (36.9 C) (Oral)   Ht 5\' 9"  (1.753 m)   Wt 207 lb (93.9 kg)   SpO2 97%   BMI 30.57 kg/m   Physical Examination:  General: Awake, alert, well nourished, nontoxic. No acute distress HEENT: Normal    Neck: No masses palpated. No lymphadenopathy    Ears: Tympanic membranes intact, normal light reflex, no erythema, no bulging    Eyes: PERRLA, extraocular membranes intact, sclera white    Nose: nasal turbinates moist, clear nasal discharge    Throat: moist mucus membranes, no erythema, no tonsillar exudate.  Airway is patent Cardio: regular rate and rhythm, S1S2 heard, no murmurs appreciated Pulm: clear to auscultation bilaterally, no wheezes, rhonchi or rales; normal work of  breathing on room air Extremities: warm, well perfused, No edema, cyanosis or clubbing; +2 pulses bilaterally MSK: normal gait and station  C-spine: Patient has full active range of motion but does have pain in the left shoulder with rotation to the left.  No tenderness to palpation midline.  There is positive paraspinal muscle tenderness to palpation on the left.  Increased tonicity noted as well.  LUE: Limited range of motion in abduction and flexion secondary to pain.  He has tenderness to palpation along the trapezius into the shoulder girdle.  No tenderness to palpation of the elbow.  Pain with Juanetta Gosling present.  Negative empty can. Skin: dry; intact; no rashes or lesions Neuro: 5/5 UE Strength and light touch sensation grossly intact  Assessment/ Plan: 52 y.o. male   1. URI with cough and congestion Patient is afebrile nontoxic-appearing with normal vital signs.  Physical exam was unremarkable.  Likely viral URI.  Robitussin-AC prescribed.  Home care instruction reviewed.  If symptoms do not improve or worsen, he will contact the office and I will prescribe an antibiotic.  Reasons for return discussed.  Follow-up PRN.  2. Acute pain of left shoulder Possibly related to the C-spine given associated numbness and tingling in the first 2 digits.  I placed him on oral anti-inflammatory to take twice daily for the next couple of weeks.  If symptoms do not improve or if they worsen, he will contact me and I will place referral to orthopedist. - methylPREDNISolone acetate (DEPO-MEDROL) injection 80 mg   No orders of the defined types were placed in this encounter.  Meds ordered this encounter  Medications  . methylPREDNISolone acetate (DEPO-MEDROL) injection 80 mg  . guaiFENesin-codeine 100-10 MG/5ML syrup    Sig: Take 5 mLs by mouth every 6 (six) hours as needed for cough.    Dispense:  120 mL    Refill:  0  . diclofenac (VOLTAREN) 75 MG EC tablet    Sig: Take 1 tablet (75 mg total) by  mouth 2 (two) times daily as needed (shoulder/ neck pain).    Dispense:  30 tablet    Refill:  0     Larry Edwin Hulen Skains, DO [facility] [phone]

## 2017-12-02 NOTE — Patient Instructions (Addendum)
It appears that you have a viral upper respiratory infection (cold).  Cold symptoms can last up to 2 weeks.  I recommend that you only use cold medications that are safe in high blood pressure like Coricidin (generic is fine).  Other cold medications can increase your blood pressure.  I have given you Robitussin with codeine that you can take up to 4 times daily if needed for cough.  Remember that this can cause sedation because it has codeine in it.  Do not operate heavy machinery or drive while taking.  If you develop any other worsening symptoms or signs we discussed, please seek reevaluation.  If you need a work note, call me.  For your back, you were given a dose of Depo-Medrol intramuscularly here today in office.  I prescribed you diclofenac to take up to twice daily if needed for pain and inflammation.  If your symptoms do not improve or if they get worse, contact me and I will place a referral to orthopedics.  [facility] number: [phone]  You have prescribed a nonsteroidal anti-inflammatory drug (NSAID) today. This will help with your pain and inflammation. Please do not take any other NSAIDs (ibuprofen/Motrin/Advil, naproxen/Aleve, meloxicam/Mobic, Voltaren/diclofenac). Please make sure to eat a meal when taking this medication.   Caution:  If you have a history of acid reflux/indigestion, I recommend that you take an antacid (such as Prilosec, Prevacid) daily while on the NSAID.  If you have a history of bleeding disorder, gastric ulcer, are on a blood thinner (like warfarin/Coumadin, Xarelto, Eliquis, etc) please do not take NSAID.  If you have ever had a heart attack, you should not take NSAIDs.      - Get plenty of rest and drink plenty of fluids. - Try to breathe moist air. Use a cold mist humidifier. - Consume warm fluids (soup or tea) to provide relief for a stuffy nose and to loosen phlegm. - For nasal stuffiness, try saline nasal spray or a Neti Pot.  Afrin nasal spray can  also be used but this product should not be used longer than 3 days or it will cause rebound nasal stuffiness (worsening nasal congestion). - For sore throat pain relief: use chloraseptic spray, suck on throat lozenges, hard candy or popsicles; gargle with warm salt water (1/4 tsp. salt per 8 oz. of water); and eat soft, bland foods. - Eat a well-balanced diet. If you cannot, ensure you are getting enough nutrients by taking a daily multivitamin. - Avoid dairy products, as they can thicken phlegm. - Avoid alcohol, as it impairs your body's immune system.  CONTACT YOUR DOCTOR IF YOU EXPERIENCE ANY OF THE FOLLOWING: - High fever - Ear pain - Sinus-type headache - Unusually severe cold symptoms - Cough that gets worse while other cold symptoms improve - Flare up of any chronic lung problem, such as asthma - Your symptoms persist longer than 2 weeks

## 2017-12-19 ENCOUNTER — Encounter: Payer: Self-pay | Admitting: Family Medicine

## 2017-12-19 DIAGNOSIS — M7542 Impingement syndrome of left shoulder: Secondary | ICD-10-CM | POA: Insufficient documentation

## 2017-12-19 DIAGNOSIS — M5030 Other cervical disc degeneration, unspecified cervical region: Secondary | ICD-10-CM | POA: Insufficient documentation

## 2018-01-14 ENCOUNTER — Encounter: Payer: Self-pay | Admitting: Gastroenterology

## 2018-02-20 ENCOUNTER — Encounter: Payer: Self-pay | Admitting: Gastroenterology

## 2018-05-22 ENCOUNTER — Telehealth: Payer: Self-pay | Admitting: Family Medicine

## 2018-05-22 ENCOUNTER — Encounter (INDEPENDENT_AMBULATORY_CARE_PROVIDER_SITE_OTHER): Payer: Self-pay

## 2018-07-31 ENCOUNTER — Other Ambulatory Visit: Payer: Self-pay | Admitting: Family Medicine

## 2018-07-31 DIAGNOSIS — I10 Essential (primary) hypertension: Secondary | ICD-10-CM

## 2018-08-31 ENCOUNTER — Other Ambulatory Visit: Payer: Self-pay | Admitting: Family Medicine

## 2018-08-31 DIAGNOSIS — I10 Essential (primary) hypertension: Secondary | ICD-10-CM

## 2018-09-01 MED ORDER — OLMESARTAN MEDOXOMIL 40 MG PO TABS: 40.0000 mg | ORAL_TABLET | Freq: Every day | ORAL | 0 refills | Status: DC

## 2018-09-01 NOTE — Telephone Encounter (Signed)
Rakes NTBS 30 days given 07/31/18

## 2018-09-01 NOTE — Addendum Note (Signed)
Addended by: Zannie Cove on: 09/01/2018 04:11 PM   Modules accepted: Orders

## 2018-09-30 ENCOUNTER — Telehealth: Payer: Self-pay

## 2018-09-30 ENCOUNTER — Telehealth: Payer: Self-pay | Admitting: *Deleted

## 2018-09-30 NOTE — Telephone Encounter (Signed)
Sent to [place], changed to losartan

## 2018-09-30 NOTE — Telephone Encounter (Signed)
Can switch to losartan 50 mg daily

## 2018-09-30 NOTE — Telephone Encounter (Signed)
Pt request New RX :  Currently paying $27 /mo for Olmesartan 40mg   Alternative :  Losartan 100-$5 Telmisartan 80-$8

## 2018-09-30 NOTE — Telephone Encounter (Signed)
Patient is requesting new RX. Paying $27 for OLMES 40mg  and requesting lower cost Alternative - losar 100mg , TELMI 80mg   Please advise and send back to [facility].

## 2018-10-01 MED ORDER — LOSARTAN POTASSIUM 50 MG PO TABS: 50.0000 mg | ORAL_TABLET | Freq: Every day | ORAL | 3 refills | Status: DC

## 2018-10-03 NOTE — Telephone Encounter (Signed)
Multiple attempts made to contact patient.  This encounter will now be closed.  Med was changed to Losartan

## 2019-01-04 ENCOUNTER — Other Ambulatory Visit: Payer: Self-pay | Admitting: Family Medicine

## 2019-01-04 DIAGNOSIS — K219 Gastro-esophageal reflux disease without esophagitis: Secondary | ICD-10-CM

## 2019-01-23 ENCOUNTER — Other Ambulatory Visit: Payer: Self-pay | Admitting: Family Medicine

## 2019-01-23 DIAGNOSIS — K219 Gastro-esophageal reflux disease without esophagitis: Secondary | ICD-10-CM

## 2019-10-22 ENCOUNTER — Other Ambulatory Visit: Payer: Self-pay | Admitting: *Deleted

## 2019-12-03 ENCOUNTER — Other Ambulatory Visit: Payer: Self-pay | Admitting: Family Medicine

## 2019-12-03 DIAGNOSIS — K219 Gastro-esophageal reflux disease without esophagitis: Secondary | ICD-10-CM

## 2019-12-03 MED ORDER — OMEPRAZOLE 40 MG PO CPDR: DELAYED_RELEASE_CAPSULE | ORAL | 0 refills | Status: DC

## 2019-12-03 MED ORDER — LOSARTAN POTASSIUM 50 MG PO TABS
50.0000 mg | ORAL_TABLET | Freq: Every day | ORAL | 0 refills | Status: DC
Start: 2019-12-03 — End: 2019-12-14

## 2019-12-03 NOTE — Telephone Encounter (Signed)
  Prescription Request  12/03/2019  What is the name of the medication or equipment? Losartan and omeprazole   Have you contacted your pharmacy to request a refill? (if applicable) yes  Which pharmacy would you like this sent to? [facility]   Patient notified that their request is being sent to the clinical staff for review and that they should receive a response within 2 business days.

## 2019-12-04 NOTE — Telephone Encounter (Signed)
LMOVM refill sent to pharmacy 

## 2019-12-14 ENCOUNTER — Ambulatory Visit (INDEPENDENT_AMBULATORY_CARE_PROVIDER_SITE_OTHER): Payer: BC Managed Care – PPO | Admitting: Nurse Practitioner

## 2019-12-14 ENCOUNTER — Other Ambulatory Visit: Payer: Self-pay

## 2019-12-14 ENCOUNTER — Encounter: Payer: Self-pay | Admitting: Nurse Practitioner

## 2019-12-14 ENCOUNTER — Encounter: Payer: Self-pay | Admitting: *Deleted

## 2019-12-14 VITALS — BP 137/80 | HR 88 | Temp 97.0°F | Resp 20 | Ht 69.0 in | Wt 205.0 lb

## 2019-12-14 DIAGNOSIS — K219 Gastro-esophageal reflux disease without esophagitis: Secondary | ICD-10-CM

## 2019-12-14 DIAGNOSIS — I10 Essential (primary) hypertension: Secondary | ICD-10-CM

## 2019-12-14 DIAGNOSIS — Z0000 Encounter for general adult medical examination without abnormal findings: Secondary | ICD-10-CM

## 2019-12-14 DIAGNOSIS — Z23 Encounter for immunization: Secondary | ICD-10-CM

## 2019-12-14 DIAGNOSIS — Z029 Encounter for administrative examinations, unspecified: Secondary | ICD-10-CM | POA: Insufficient documentation

## 2019-12-14 MED ORDER — OMEPRAZOLE 40 MG PO CPDR: DELAYED_RELEASE_CAPSULE | ORAL | 3 refills | Status: DC

## 2019-12-14 MED ORDER — LOSARTAN POTASSIUM 50 MG PO TABS: 50.0000 mg | ORAL_TABLET | Freq: Every day | ORAL | 2 refills | Status: DC

## 2019-12-14 NOTE — Progress Notes (Signed)
Established Patient Office Visit  Subjective:  Patient ID: Larry Patel, male    DOB: March 23, 1965  Age: 54 y.o. MRN: [id number]  CC:  Chief Complaint  Patient presents with  . Medical Management of Chronic Issues  . Hypertension    HPI Larry Patel presents for Pt presents for follow up of hypertension. Patient was diagnosed in 06/25/2012.  The patient is tolerating the medication well without side effects. Compliance with treatment has been good; including taking medication as directed , maintains a healthy diet and regular exercise regimen , and following up as directed.  Current medication Cozaar 50 mg tablet by mouth daily.   GERD, Follow up:  The patient was last seen for GERD 4 years ago. Changes made since that visit include none.  He reports excellent compliance with treatment. He is not having side effects. Marland Kitchen  He IS experiencing No side effects.Marland Kitchen He is NOT experiencing belching, belching and eructation, bilious reflux, chest pain, choking on food, cough or difficulty swallowing  -----------------------------------------------------------------------------------------   Past Medical History:  Diagnosis Date  . GERD (gastroesophageal reflux disease)   . Hypertension     Past Surgical History:  Procedure Laterality Date  . SCAR REVISION Right     Family History  Problem Relation Age of Onset  . Healthy Mother   . Hypertension Father   . Healthy Sister   . Diabetes Maternal Aunt     Social History   Socioeconomic History  . Marital status: Married    Spouse name: Not on file  . Number of children: Not on file  . Years of education: Not on file  . Highest education level: Not on file  Occupational History  . Not on file  Tobacco Use  . Smoking status: Former Smoker    Types: Cigarettes    Quit date: 06/25/1996    Years since quitting: 23.4  . Smokeless tobacco: Never Used  Vaping Use  . Vaping Use: Never used  Substance and Sexual Activity  .  Alcohol use: Not Currently  . Drug use: No  . Sexual activity: Not on file  Other Topics Concern  . Not on file  Social History Narrative  . Not on file   Social Determinants of Health   Financial Resource Strain: Not on file  Food Insecurity: Not on file  Transportation Needs: Not on file  Physical Activity: Not on file  Stress: Not on file  Social Connections: Not on file  Intimate Partner Violence: Not on file    Outpatient Medications Prior to Visit  Medication Sig Dispense Refill  . losartan (COZAAR) 50 MG tablet Take 1 tablet (50 mg total) by mouth daily. 30 tablet 0  . omeprazole (PRILOSEC) 40 MG capsule TAKE 1 CAPSULE BY MOUTH EVERY DAY 30 capsule 0  . diclofenac (VOLTAREN) 75 MG EC tablet Take 1 tablet (75 mg total) by mouth 2 (two) times daily as needed (shoulder/ neck pain). 30 tablet 0  . guaiFENesin-codeine 100-10 MG/5ML syrup Take 5 mLs by mouth every 6 (six) hours as needed for cough. 120 mL 0   No facility-administered medications prior to visit.    ROS Review of Systems  Gastrointestinal: Negative for abdominal distention, abdominal pain, constipation, nausea and vomiting.  Neurological: Negative for dizziness, light-headedness and headaches.  All other systems reviewed and are negative.     Objective:    Physical Exam Vitals reviewed.  Constitutional:      General: He is awake.     Appearance:  Normal appearance. He is well-groomed and overweight.     Interventions: Face mask in place.  HENT:     Head: Normocephalic.     Nose: Nose normal.  Eyes:     Conjunctiva/sclera: Conjunctivae normal.  Cardiovascular:     Rate and Rhythm: Normal rate.     Pulses: Normal pulses.     Heart sounds: Normal heart sounds.  Pulmonary:     Effort: Pulmonary effort is normal.     Breath sounds: Normal breath sounds.  Abdominal:     General: Bowel sounds are normal.  Musculoskeletal:        General: Normal range of motion.     Cervical back: Normal range of  motion.  Skin:    General: Skin is warm.  Neurological:     Mental Status: He is alert and oriented to person, place, and time.  Psychiatric:        Mood and Affect: Mood normal.        Behavior: Behavior normal. Behavior is cooperative.     BP 137/80   Pulse 88   Temp (!) 97 F (36.1 C)   Resp 20   Ht 5\' 9"  (1.753 m)   Wt 205 lb (93 kg)   SpO2 95%   BMI 30.27 kg/m  Wt Readings from Last 3 Encounters:  12/14/19 205 lb (93 kg)  12/02/17 207 lb (93.9 kg)  11/05/17 208 lb 6 oz (94.5 kg)     Health Maintenance Due  Topic Date Due  . COLONOSCOPY  Never done     Lab Results  Component Value Date   TSH 2.290 07/28/2015   Lab Results  Component Value Date   WBC 7.0 11/05/2017   HGB 13.7 11/05/2017   HCT 40.1 11/05/2017   MCV 94 11/05/2017   PLT 231 11/05/2017   Lab Results  Component Value Date   NA 141 11/05/2017   K 4.2 11/05/2017   CO2 23 11/05/2017   GLUCOSE 109 (H) 11/05/2017   BUN 13 11/05/2017   CREATININE 1.03 11/05/2017   BILITOT <0.2 11/05/2017   ALKPHOS 69 11/05/2017   AST 20 11/05/2017   ALT 17 11/05/2017   PROT 6.9 11/05/2017   ALBUMIN 4.1 11/05/2017   CALCIUM 9.2 11/05/2017   Lab Results  Component Value Date   CHOL 177 11/05/2017   Lab Results  Component Value Date   HDL 76 11/05/2017   Lab Results  Component Value Date   LDLCALC 83 11/05/2017   Lab Results  Component Value Date   TRIG 88 11/05/2017   Lab Results  Component Value Date   CHOLHDL 2.3 11/05/2017   No results found for: HGBA1C    Assessment & Plan:   Problem List Items Addressed This Visit      Cardiovascular and Mediastinum   Hypertension - Primary    Managed on current medication, Cozaar 50 mg tablet by mouth daily. Rx refill sent to pharmacy Continue to provide education on healthy diet and exercise regimen as tolerated. Follow-up in 3 months.      Relevant Medications   losartan (COZAAR) 50 MG tablet     Digestive   GERD (gastroesophageal  reflux disease)    Well-controlled on Prilosec 40 mg capsule by mouth daily. Refill sent to pharmacy. Continue healthy diet and exercise regimen as tolerated.  Follow-up with any worsening or unresolved symptoms.  Education provided with printed handouts given      Relevant Medications   omeprazole (PRILOSEC) 40 MG  capsule     Other   Health care maintenance   Relevant Orders   Cologuard    Other Visit Diagnoses    Need for immunization against influenza       Relevant Orders   Flu Vaccine QUAD 36+ mos IM (Completed)      Meds ordered this encounter  Medications  . losartan (COZAAR) 50 MG tablet    Sig: Take 1 tablet (50 mg total) by mouth daily.    Dispense:  30 tablet    Refill:  2    Order Specific Question:   Supervising Provider    Answer:   Arville Care A [id number]  . omeprazole (PRILOSEC) 40 MG capsule    Sig: TAKE 1 CAPSULE BY MOUTH EVERY DAY    Dispense:  30 capsule    Refill:  3    Order Specific Question:   Supervising Provider    Answer:   Arville Care A [[id number]]    Follow-up: Return if symptoms worsen or fail to improve.    Daryll Drown, NP

## 2019-12-14 NOTE — Patient Instructions (Signed)
Gastroesophageal Reflux Disease, Adult Gastroesophageal reflux (GER) happens when acid from the stomach flows up into the tube that connects the mouth and the stomach (esophagus). Normally, food travels down the esophagus and stays in the stomach to be digested. With GER, food and stomach acid sometimes move back up into the esophagus. You may have a disease called gastroesophageal reflux disease (GERD) if the reflux:  Happens often.  Causes frequent or very bad symptoms.  Causes problems such as damage to the esophagus. When this happens, the esophagus becomes sore and swollen (inflamed). Over time, GERD can make small holes (ulcers) in the lining of the esophagus. What are the causes? This condition is caused by a problem with the muscle between the esophagus and the stomach. When this muscle is weak or not normal, it does not close properly to keep food and acid from coming back up from the stomach. The muscle can be weak because of:  Tobacco use.  Pregnancy.  Having a certain type of hernia (hiatal hernia).  Alcohol use.  Certain foods and drinks, such as coffee, chocolate, onions, and peppermint. What increases the risk? You are more likely to develop this condition if you:  Are overweight.  Have a disease that affects your connective tissue.  Use NSAID medicines. What are the signs or symptoms? Symptoms of this condition include:  Heartburn.  Difficult or painful swallowing.  The feeling of having a lump in the throat.  A bitter taste in the mouth.  Bad breath.  Having a lot of saliva.  Having an upset or bloated stomach.  Belching.  Chest pain. Different conditions can cause chest pain. Make sure you see your doctor if you have chest pain.  Shortness of breath or noisy breathing (wheezing).  Ongoing (chronic) cough or a cough at night.  Wearing away of the surface of teeth (tooth enamel).  Weight loss. How is this treated? Treatment will depend on how  bad your symptoms are. Your doctor may suggest:  Changes to your diet.  Medicine.  Surgery. Follow these instructions at home: Eating and drinking   Follow a diet as told by your doctor. You may need to avoid foods and drinks such as: ? Coffee and tea (with or without caffeine). ? Drinks that contain alcohol. ? Energy drinks and sports drinks. ? Bubbly (carbonated) drinks or sodas. ? Chocolate and cocoa. ? Peppermint and mint flavorings. ? Garlic and onions. ? Horseradish. ? Spicy and acidic foods. These include peppers, chili powder, curry powder, vinegar, hot sauces, and BBQ sauce. ? Citrus fruit juices and citrus fruits, such as oranges, lemons, and limes. ? Tomato-based foods. These include red sauce, chili, salsa, and pizza with red sauce. ? Fried and fatty foods. These include donuts, french fries, potato chips, and high-fat dressings. ? High-fat meats. These include hot dogs, rib eye steak, sausage, ham, and bacon. ? High-fat dairy items, such as whole milk, butter, and cream cheese.  Eat small meals often. Avoid eating large meals.  Avoid drinking large amounts of liquid with your meals.  Avoid eating meals during the 2-3 hours before bedtime.  Avoid lying down right after you eat.  Do not exercise right after you eat. Lifestyle   Do not use any products that contain nicotine or tobacco. These include cigarettes, e-cigarettes, and chewing tobacco. If you need help quitting, ask your doctor.  Try to lower your stress. If you need help doing this, ask your doctor.  If you are overweight, lose an amount   of weight that is healthy for you. Ask your doctor about a safe weight loss goal. General instructions  Pay attention to any changes in your symptoms.  Take over-the-counter and prescription medicines only as told by your doctor. Do not take aspirin, ibuprofen, or other NSAIDs unless your doctor says it is okay.  Wear loose clothes. Do not wear anything tight  around your waist.  Raise (elevate) the head of your bed about 6 inches (15 cm).  Avoid bending over if this makes your symptoms worse.  Keep all follow-up visits as told by your doctor. This is important. Contact a doctor if:  You have new symptoms.  You lose weight and you do not know why.  You have trouble swallowing or it hurts to swallow.  You have wheezing or a cough that keeps happening.  Your symptoms do not get better with treatment.  You have a hoarse voice. Get help right away if:  You have pain in your arms, neck, jaw, teeth, or back.  You feel sweaty, dizzy, or light-headed.  You have chest pain or shortness of breath.  You throw up (vomit) and your throw-up looks like blood or coffee grounds.  You pass out (faint).  Your poop (stool) is bloody or black.  You cannot swallow, drink, or eat. Summary  If a person has gastroesophageal reflux disease (GERD), food and stomach acid move back up into the esophagus and cause symptoms or problems such as damage to the esophagus.  Treatment will depend on how bad your symptoms are.  Follow a diet as told by your doctor.  Take all medicines only as told by your doctor. This information is not intended to replace advice given to you by your health care provider. Make sure you discuss any questions you have with your health care provider. Document Revised: 06/26/2017 Document Reviewed: 06/26/2017 Elsevier Patient Education  2020 [employer]   Hypertension, Adult Hypertension is another name for high blood pressure. High blood pressure forces your heart to work harder to pump blood. This can cause problems over time. There are two numbers in a blood pressure reading. There is a top number (systolic) over a bottom number (diastolic). It is best to have a blood pressure that is below 120/80. Healthy choices can help lower your blood pressure, or you may need medicine to help lower it. What are the causes? The cause  of this condition is not known. Some conditions may be related to high blood pressure. What increases the risk?  Smoking.  Having type 2 diabetes mellitus, high cholesterol, or both.  Not getting enough exercise or physical activity.  Being overweight.  Having too much fat, sugar, calories, or salt (sodium) in your diet.  Drinking too much alcohol.  Having long-term (chronic) kidney disease.  Having a family history of high blood pressure.  Age. Risk increases with age.  Race. You may be at higher risk if you are African American.  Gender. Men are at higher risk than women before age 45. After age 65, women are at higher risk than men.  Having obstructive sleep apnea.  Stress. What are the signs or symptoms?  High blood pressure may not cause symptoms. Very high blood pressure (hypertensive crisis) may cause: ? Headache. ? Feelings of worry or nervousness (anxiety). ? Shortness of breath. ? Nosebleed. ? A feeling of being sick to your stomach (nausea). ? Throwing up (vomiting). ? Changes in how you see. ? Very bad chest pain. ? Seizures. How   is this treated?  This condition is treated by making healthy lifestyle changes, such as: ? Eating healthy foods. ? Exercising more. ? Drinking less alcohol.  Your health care provider may prescribe medicine if lifestyle changes are not enough to get your blood pressure under control, and if: ? Your top number is above 130. ? Your bottom number is above 80.  Your personal target blood pressure may vary. Follow these instructions at home: Eating and drinking   If told, follow the DASH eating plan. To follow this plan: ? Fill one half of your plate at each meal with fruits and vegetables. ? Fill one fourth of your plate at each meal with whole grains. Whole grains include whole-wheat pasta, brown rice, and whole-grain bread. ? Eat or drink low-fat dairy products, such as skim milk or low-fat yogurt. ? Fill one fourth of  your plate at each meal with low-fat (lean) proteins. Low-fat proteins include fish, chicken without skin, eggs, beans, and tofu. ? Avoid fatty meat, cured and processed meat, or chicken with skin. ? Avoid pre-made or processed food.  Eat less than 1,500 mg of salt each day.  Do not drink alcohol if: ? Your doctor tells you not to drink. ? You are pregnant, may be pregnant, or are planning to become pregnant.  If you drink alcohol: ? Limit how much you use to:  0-1 drink a day for women.  0-2 drinks a day for men. ? Be aware of how much alcohol is in your drink. In the [place] one drink equals one 12 oz bottle of beer (355 mL), one 5 oz glass of wine (148 mL), or one 1 oz glass of hard liquor (44 mL). Lifestyle   Work with your doctor to stay at a healthy weight or to lose weight. Ask your doctor what the best weight is for you.  Get at least 30 minutes of exercise most days of the week. This may include walking, swimming, or biking.  Get at least 30 minutes of exercise that strengthens your muscles (resistance exercise) at least 3 days a week. This may include lifting weights or doing Pilates.  Do not use any products that contain nicotine or tobacco, such as cigarettes, e-cigarettes, and chewing tobacco. If you need help quitting, ask your doctor.  Check your blood pressure at home as told by your doctor.  Keep all follow-up visits as told by your doctor. This is important. Medicines  Take over-the-counter and prescription medicines only as told by your doctor. Follow directions carefully.  Do not skip doses of blood pressure medicine. The medicine does not work as well if you skip doses. Skipping doses also puts you at risk for problems.  Ask your doctor about side effects or reactions to medicines that you should watch for. Contact a doctor if you:  Think you are having a reaction to the medicine you are taking.  Have headaches that keep coming back (recurring).  Feel  dizzy.  Have swelling in your ankles.  Have trouble with your vision. Get help right away if you:  Get a very bad headache.  Start to feel mixed up (confused).  Feel weak or numb.  Feel faint.  Have very bad pain in your: ? Chest. ? Belly (abdomen).  Throw up more than once.  Have trouble breathing. Summary  Hypertension is another name for high blood pressure.  High blood pressure forces your heart to work harder to pump blood.  For most people, a normal   blood pressure is less than 120/80.  Making healthy choices can help lower blood pressure. If your blood pressure does not get lower with healthy choices, you may need to take medicine. This information is not intended to replace advice given to you by your health care provider. Make sure you discuss any questions you have with your health care provider. Document Revised: 08/28/2017 Document Reviewed: 08/28/2017 Elsevier Patient Education  2020 [employer]  

## 2019-12-14 NOTE — Assessment & Plan Note (Signed)
Managed on current medication, Cozaar 50 mg tablet by mouth daily. Rx refill sent to pharmacy Continue to provide education on healthy diet and exercise regimen as tolerated. Follow-up in 3 months.

## 2019-12-14 NOTE — Assessment & Plan Note (Signed)
Well-controlled on Prilosec 40 mg capsule by mouth daily. Refill sent to pharmacy. Continue healthy diet and exercise regimen as tolerated.  Follow-up with any worsening or unresolved symptoms.  Education provided with printed handouts given

## 2020-01-11 ENCOUNTER — Ambulatory Visit (INDEPENDENT_AMBULATORY_CARE_PROVIDER_SITE_OTHER): Payer: BC Managed Care – PPO | Admitting: Nurse Practitioner

## 2020-01-11 ENCOUNTER — Other Ambulatory Visit: Payer: Self-pay

## 2020-01-11 ENCOUNTER — Encounter: Payer: Self-pay | Admitting: Nurse Practitioner

## 2020-01-11 VITALS — BP 143/93 | HR 80 | Temp 98.3°F | Ht 69.0 in | Wt 212.4 lb

## 2020-01-11 DIAGNOSIS — Z0000 Encounter for general adult medical examination without abnormal findings: Secondary | ICD-10-CM

## 2020-01-11 NOTE — Assessment & Plan Note (Addendum)
Patient is a 55 year old male who presents to clinic for an annual physical exam.  [facility].  Patient education on health maintenance and preventative care.  Completed labs-lipid panel CBC CMP, cologaurd and TSH.  With results pending. Continue healthy diet and exercise regimen as tolerated. Follow-up 3 months blood pressure and 1 year annual physical.

## 2020-01-11 NOTE — Progress Notes (Signed)
Established Patient Office Visit  Subjective:  Patient ID: Larry Patel, male    DOB: August 07, 1965  Age: 55 y.o. MRN: [id number]  CC:  Chief Complaint  Patient presents with  . Annual Exam    HPI [name] presents for .   Encounter for general adult medical examination without abnormal findings  Physical ("At Risk" items are starred): Patient's last physical exam was 1 year ago .  Weight: Appropriate for height (BMI less than 27%) ; no Blood Pressure: Normal (BP less than 120/80) ; no Medical History: Patient history reviewed ; Family history reviewed ; yes Allergies Reviewed: No change in current allergies ; no Medications Reviewed: Medications reviewed - no changes ;  Lipids: Normal lipid levels ; labs completed results pending Smoking: Life-long non-smoker ; yes Physical Activity: Exercises at least 3 times per week ; yes Alcohol/Drug Use: Is a non-drinker ; No illicit drug use ; no  Safety: reviewed ; Patient wears a seat belt, has smoke detectors, has carbon monoxide detectors, practices appropriate gun safety, and wears sunscreen with extended sun exposure. Dental Care: biannual cleanings, brushes and flosses daily. Ophthalmology/Optometry: Annual visit.  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 GERD (gastroesophageal reflux disease)   . Hypertension     Past Surgical History:  Procedure Laterality Date  . SCAR REVISION Right     Family History  Problem Relation Age of Onset  . Healthy Mother   . Hypertension Father   . Healthy Sister   . Diabetes Maternal Aunt     Social History   Socioeconomic History  . Marital status: Married    Spouse name: Not on file  . Number of children: Not on file  . Years of education: Not on file  . Highest education level: Not on file  Occupational History  . Not on file  Tobacco Use  . Smoking status: Former Smoker    Types: Cigarettes     Quit date: 06/25/1996    Years since quitting: 23.5  . Smokeless tobacco: Never Used  Vaping Use  . Vaping Use: Never used  Substance and Sexual Activity  . Alcohol use: Not Currently  . Drug use: No  . Sexual activity: Not on file  Other Topics Concern  . Not on file  Social History Narrative  . Not on file   Social Determinants of Health   Financial Resource Strain: Not on file  Food Insecurity: Not on file  Transportation Needs: Not on file  Physical Activity: Not on file  Stress: Not on file  Social Connections: Not on file  Intimate Partner Violence: Not on file    Outpatient Medications Prior to Visit  Medication Sig Dispense Refill  . losartan (COZAAR) 50 MG tablet Take 1 tablet (50 mg total) by mouth daily. 30 tablet 2  . omeprazole (PRILOSEC) 40 MG capsule TAKE 1 CAPSULE BY MOUTH EVERY DAY 30 capsule 3   No facility-administered medications prior to visit.    No Known Allergies  ROS Review of Systems  Constitutional: Negative.   HENT: Negative.   Eyes: Negative.   Respiratory: Negative.   Cardiovascular: Negative.   Gastrointestinal: Negative.   Musculoskeletal: Negative.   Skin: Negative.   Allergic/Immunologic: Negative.   Neurological: Negative.   Hematological: Negative.   Psychiatric/Behavioral: Negative.   All other systems reviewed and are negative.     Objective:    Physical Exam Vitals  reviewed.  Constitutional:      General: He is awake.     Appearance: Normal appearance. He is overweight.     Interventions: Face mask in place.  HENT:     Head: Normocephalic.     Nose: Nose normal.  Eyes:     General:        Right eye: No discharge.        Left eye: No discharge.     Conjunctiva/sclera: Conjunctivae normal.  Cardiovascular:     Rate and Rhythm: Normal rate and regular rhythm.     Pulses: Normal pulses.     Heart sounds: Normal heart sounds.  Pulmonary:     Effort: Pulmonary effort is normal.     Breath sounds: Normal  breath sounds.  Abdominal:     General: Bowel sounds are normal.  Musculoskeletal:        General: Normal range of motion.     Cervical back: Normal range of motion.  Skin:    General: Skin is warm.  Neurological:     Mental Status: He is alert and oriented to person, place, and time. Mental status is at baseline.  Psychiatric:        Mood and Affect: Mood normal.        Behavior: Behavior normal. Behavior is cooperative.     BP (!) 143/93   Pulse 80   Temp 98.3 F (36.8 C) (Temporal)   Ht 5\' 9"  (1.753 m)   Wt 212 lb 6.4 oz (96.3 kg)   SpO2 96%   BMI 31.37 kg/m  Wt Readings from Last 3 Encounters:  01/11/20 212 lb 6.4 oz (96.3 kg)  12/14/19 205 lb (93 kg)  12/02/17 207 lb (93.9 kg)     Health Maintenance Due  Topic Date Due  . COVID-19 Vaccine (1) Never done  . COLONOSCOPY (Pts 45-58yrs Insurance coverage will need to be confirmed)  Never done    There are no preventive care reminders to display for this patient.  Lab Results  Component Value Date   TSH 2.290 07/28/2015   Lab Results  Component Value Date   WBC 7.0 11/05/2017   HGB 13.7 11/05/2017   HCT 40.1 11/05/2017   MCV 94 11/05/2017   PLT 231 11/05/2017   Lab Results  Component Value Date   NA 141 11/05/2017   K 4.2 11/05/2017   CO2 23 11/05/2017   GLUCOSE 109 (H) 11/05/2017   BUN 13 11/05/2017   CREATININE 1.03 11/05/2017   BILITOT <0.2 11/05/2017   ALKPHOS 69 11/05/2017   AST 20 11/05/2017   ALT 17 11/05/2017   PROT 6.9 11/05/2017   ALBUMIN 4.1 11/05/2017   CALCIUM 9.2 11/05/2017   Lab Results  Component Value Date   CHOL 177 11/05/2017   Lab Results  Component Value Date   HDL 76 11/05/2017   Lab Results  Component Value Date   LDLCALC 83 11/05/2017   Lab Results  Component Value Date   TRIG 88 11/05/2017   Lab Results  Component Value Date   CHOLHDL 2.3 11/05/2017   No results found for: HGBA1C    Assessment & Plan:   Problem List Items Addressed This Visit       Other   Annual physical exam - Primary    Patient is a 55 year old male who presents to clinic for an annual physical exam.  Completed [facility].  Patient education on health maintenance and preventative care.  Completed labs-lipid panel CBC  CMP and TSH.  With results pending. Continue healthy diet and exercise regimen as tolerated. Follow-up 3 months blood pressure and 1 year annual physical.      Relevant Orders   Cologuard   Lipid Panel   CBC with Differential   Comprehensive metabolic panel      No orders of the defined types were placed in this encounter.   Follow-up: Return in about 6 months (around 07/10/2020) for blood pressure.    Daryll Drown, NP

## 2020-01-11 NOTE — Patient Instructions (Signed)

## 2020-01-12 LAB — COMPREHENSIVE METABOLIC PANEL
ALT: 21 IU/L (ref 0–44)
AST: 22 IU/L (ref 0–40)
Albumin/Globulin Ratio: 1.4 (ref 1.2–2.2)
Albumin: 4.3 g/dL (ref 3.8–4.9)
Alkaline Phosphatase: 71 IU/L (ref 44–121)
BUN/Creatinine Ratio: 12 (ref 9–20)
BUN: 11 mg/dL (ref 6–24)
Bilirubin Total: <0.2 mg/dL (ref 0.0–1.2)
CO2: 20 mmol/L (ref 20–29)
Calcium: 9.2 mg/dL (ref 8.7–10.2)
Chloride: 104 mmol/L (ref 96–106)
Creatinine, Ser: 0.95 mg/dL (ref 0.76–1.27)
GFR calc Af Amer: 106 mL/min/{1.73_m2} (ref >59)
GFR calc non Af Amer: 92 mL/min/{1.73_m2} (ref >59)
Globulin, Total: 3 g/dL (ref 1.5–4.5)
Glucose: 94 mg/dL (ref 65–99)
Potassium: 4 mmol/L (ref 3.5–5.2)
Sodium: 142 mmol/L (ref 134–144)
Total Protein: 7.3 g/dL (ref 6.0–8.5)

## 2020-01-12 LAB — LIPID PANEL
Chol/HDL Ratio: 2.5 ratio (ref 0.0–5.0)
Cholesterol, Total: 195 mg/dL (ref 100–199)
HDL: 78 mg/dL (ref >39)
LDL Chol Calc (NIH): 95 mg/dL (ref 0–99)
Triglycerides: 126 mg/dL (ref 0–149)
VLDL Cholesterol Cal: 22 mg/dL (ref 5–40)

## 2020-01-12 LAB — CBC WITH DIFFERENTIAL/PLATELET
Basophils Absolute: 0 10*3/uL (ref 0.0–0.2)
Basos: 1 %
EOS (ABSOLUTE): 0.2 10*3/uL (ref 0.0–0.4)
Eos: 3 %
Hematocrit: 40.6 % (ref 37.5–51.0)
Hemoglobin: 14.4 g/dL (ref 13.0–17.7)
Immature Grans (Abs): 0 10*3/uL (ref 0.0–0.1)
Immature Granulocytes: 0 %
Lymphocytes Absolute: 3.2 10*3/uL — ABNORMAL HIGH (ref 0.7–3.1)
Lymphs: 37 %
MCH: 32.7 pg (ref 26.6–33.0)
MCHC: 35.5 g/dL (ref 31.5–35.7)
MCV: 92 fL (ref 79–97)
Monocytes Absolute: 0.6 10*3/uL (ref 0.1–0.9)
Monocytes: 6 %
Neutrophils Absolute: 4.6 10*3/uL (ref 1.4–7.0)
Neutrophils: 53 %
Platelets: 241 10*3/uL (ref 150–450)
RBC: 4.4 x10E6/uL (ref 4.14–5.80)
RDW: 12.6 % (ref 11.6–15.4)
WBC: 8.7 10*3/uL (ref 3.4–10.8)

## 2020-02-18 ENCOUNTER — Other Ambulatory Visit: Payer: Self-pay | Admitting: *Deleted

## 2020-02-18 DIAGNOSIS — K219 Gastro-esophageal reflux disease without esophagitis: Secondary | ICD-10-CM

## 2020-02-18 MED ORDER — OMEPRAZOLE 40 MG PO CPDR: DELAYED_RELEASE_CAPSULE | ORAL | 1 refills | Status: DC

## 2020-04-01 ENCOUNTER — Telehealth: Payer: Self-pay | Admitting: *Deleted

## 2020-04-01 DIAGNOSIS — I10 Essential (primary) hypertension: Secondary | ICD-10-CM

## 2020-04-01 MED ORDER — LOSARTAN POTASSIUM 50 MG PO TABS: 50.0000 mg | ORAL_TABLET | Freq: Every day | ORAL | 0 refills | Status: DC

## 2020-04-01 NOTE — Telephone Encounter (Signed)
Refill sent.

## 2020-04-20 ENCOUNTER — Encounter: Payer: Self-pay | Admitting: Nurse Practitioner

## 2020-04-20 ENCOUNTER — Other Ambulatory Visit: Payer: Self-pay

## 2020-04-20 ENCOUNTER — Ambulatory Visit: Payer: BC Managed Care – PPO | Admitting: Nurse Practitioner

## 2020-04-20 VITALS — BP 133/79 | HR 86 | Temp 98.5°F | Ht 69.0 in | Wt 209.0 lb

## 2020-04-20 DIAGNOSIS — S00261A Insect bite (nonvenomous) of right eyelid and periocular area, initial encounter: Secondary | ICD-10-CM | POA: Diagnosis not present

## 2020-04-20 DIAGNOSIS — W57XXXA Bitten or stung by nonvenomous insect and other nonvenomous arthropods, initial encounter: Secondary | ICD-10-CM

## 2020-04-20 DIAGNOSIS — S00269A Insect bite (nonvenomous) of unspecified eyelid and periocular area, initial encounter: Secondary | ICD-10-CM | POA: Insufficient documentation

## 2020-04-20 NOTE — Patient Instructions (Signed)

## 2020-04-20 NOTE — Progress Notes (Signed)
Acute Office Visit  Subjective:    Patient ID: Larry Patel, male    DOB: 1965-12-08, 55 y.o.   MRN: [id number]  Chief Complaint  Patient presents with  . Insect Bite    HPI Patient is in today for insect bite on right eyebrow.  In the last few days.  Eyelid inflamed pain radiating down right ear to jawline.  Patient was seen by telemetry doctor and given hydroxyzine and Duricef.  Patient still reports slight soreness on jaw and puffy eyes but reports symptoms are better.  Patient has taking antibiotic for only 2 days and have 4 more days to go.  Patient just wanted reassurance that he has no worsening symptoms from event.  Past Medical History:  Diagnosis Date  . GERD (gastroesophageal reflux disease)   . Hypertension     Past Surgical History:  Procedure Laterality Date  . SCAR REVISION Right     Family History  Problem Relation Age of Onset  . Healthy Mother   . Hypertension Father   . Healthy Sister   . Diabetes Maternal Aunt     Social History   Socioeconomic History  . Marital status: Married    Spouse name: Not on file  . Number of children: Not on file  . Years of education: Not on file  . Highest education level: Not on file  Occupational History  . Not on file  Tobacco Use  . Smoking status: Former Smoker    Types: Cigarettes    Quit date: 06/25/1996    Years since quitting: 23.8  . Smokeless tobacco: Never Used  Vaping Use  . Vaping Use: Never used  Substance and Sexual Activity  . Alcohol use: Not Currently  . Drug use: No  . Sexual activity: Not on file  Other Topics Concern  . Not on file  Social History Narrative  . Not on file   Social Determinants of Health   Financial Resource Strain: Not on file  Food Insecurity: Not on file  Transportation Needs: Not on file  Physical Activity: Not on file  Stress: Not on file  Social Connections: Not on file  Intimate Partner Violence: Not on file    Outpatient Medications Prior to Visit   Medication Sig Dispense Refill  . cefadroxil (DURICEF) 500 MG capsule Take 500 mg by mouth 2 (two) times daily.    . hydrOXYzine (ATARAX/VISTARIL) 25 MG tablet Take 25 mg by mouth 4 (four) times daily.    Marland Kitchen losartan (COZAAR) 50 MG tablet Take 1 tablet (50 mg total) by mouth daily. 90 tablet 0  . omeprazole (PRILOSEC) 40 MG capsule TAKE 1 CAPSULE BY MOUTH EVERY DAY 90 capsule 1   No facility-administered medications prior to visit.    No Known Allergies  Review of Systems  Constitutional: Negative.   HENT: Negative.   Eyes: Positive for pain and itching. Negative for photophobia, redness and visual disturbance.  Respiratory: Negative.   Cardiovascular: Negative.   All other systems reviewed and are negative.      Objective:    Physical Exam Vitals reviewed.  Constitutional:      Appearance: Normal appearance.  Eyes:     General:        Right eye: No foreign body, discharge or hordeolum.     Conjunctiva/sclera: Conjunctivae normal.      Comments: Insect like bite on eye brow, skin is swollen and slightly erythematous  Cardiovascular:     Rate and Rhythm: Normal rate and  regular rhythm.     Pulses: Normal pulses.     Heart sounds: Normal heart sounds.  Pulmonary:     Effort: Pulmonary effort is normal.     Breath sounds: Normal breath sounds.  Abdominal:     General: Bowel sounds are normal.  Musculoskeletal:     Cervical back: Normal range of motion.  Neurological:     Mental Status: He is alert.     BP 133/79   Pulse 86   Temp 98.5 F (36.9 C) (Temporal)   Ht 5\' 9"  (1.753 m)   Wt 209 lb (94.8 kg)   SpO2 100%   BMI 30.86 kg/m  Wt Readings from Last 3 Encounters:  04/20/20 209 lb (94.8 kg)  01/11/20 212 lb 6.4 oz (96.3 kg)  12/14/19 205 lb (93 kg)    Health Maintenance Due  Topic Date Due  . COLONOSCOPY (Pts 45-21yrs Insurance coverage will need to be confirmed)  Never done  . COVID-19 Vaccine (2 - Moderna 3-dose series) 01/21/2020    There are no  preventive care reminders to display for this patient.   Lab Results  Component Value Date   TSH 2.290 07/28/2015   Lab Results  Component Value Date   WBC 8.7 01/11/2020   HGB 14.4 01/11/2020   HCT 40.6 01/11/2020   MCV 92 01/11/2020   PLT 241 01/11/2020   Lab Results  Component Value Date   NA 142 01/11/2020   K 4.0 01/11/2020   CO2 20 01/11/2020   GLUCOSE 94 01/11/2020   BUN 11 01/11/2020   CREATININE 0.95 01/11/2020   BILITOT <0.2 01/11/2020   ALKPHOS 71 01/11/2020   AST 22 01/11/2020   ALT 21 01/11/2020   PROT 7.3 01/11/2020   ALBUMIN 4.3 01/11/2020   CALCIUM 9.2 01/11/2020   Lab Results  Component Value Date   CHOL 195 01/11/2020   Lab Results  Component Value Date   HDL 78 01/11/2020   Lab Results  Component Value Date   LDLCALC 95 01/11/2020   Lab Results  Component Value Date   TRIG 126 01/11/2020   Lab Results  Component Value Date   CHOLHDL 2.5 01/11/2020   No results found for: HGBA1C     Assessment & Plan:   Problem List Items Addressed This Visit      Musculoskeletal and Integument   Insect bite of eye region - Primary    Insect bite of the right eye symptoms are gradually improving.  Patient on hydroxyzine and Duricef given by tele-doc.  Encourage patient to continue with current medication and follow-up with worsening or unresolved symptoms.          No orders of the defined types were placed in this encounter.    03/10/2020, NP

## 2020-04-20 NOTE — Assessment & Plan Note (Signed)
Insect bite of the right eye symptoms are gradually improving.  Patient on hydroxyzine and Duricef given by tele-doc.  Encourage patient to continue with current medication and follow-up with worsening or unresolved symptoms.

## 2020-05-26 ENCOUNTER — Other Ambulatory Visit: Payer: Self-pay

## 2020-05-26 DIAGNOSIS — K219 Gastro-esophageal reflux disease without esophagitis: Secondary | ICD-10-CM

## 2020-05-26 MED ORDER — OMEPRAZOLE 40 MG PO CPDR: DELAYED_RELEASE_CAPSULE | ORAL | 1 refills | Status: DC

## 2020-06-14 ENCOUNTER — Encounter (HOSPITAL_COMMUNITY): Payer: Self-pay

## 2020-06-14 ENCOUNTER — Emergency Department (HOSPITAL_COMMUNITY): Payer: BC Managed Care – PPO

## 2020-06-14 ENCOUNTER — Other Ambulatory Visit: Payer: Self-pay

## 2020-06-14 ENCOUNTER — Emergency Department (HOSPITAL_COMMUNITY)
Admission: EM | Admit: 2020-06-14 | Discharge: 2020-06-14 | Disposition: A | Discharge status: Home / Self Care | Payer: BC Managed Care – PPO | Attending: Emergency Medicine | Admitting: Emergency Medicine

## 2020-06-14 DIAGNOSIS — R531 Weakness: Secondary | ICD-10-CM | POA: Insufficient documentation

## 2020-06-14 DIAGNOSIS — T6701XA Heatstroke and sunstroke, initial encounter: Secondary | ICD-10-CM | POA: Diagnosis not present

## 2020-06-14 DIAGNOSIS — G9340 Encephalopathy, unspecified: Secondary | ICD-10-CM

## 2020-06-14 DIAGNOSIS — R110 Nausea: Secondary | ICD-10-CM | POA: Diagnosis not present

## 2020-06-14 DIAGNOSIS — Z20822 Contact with and (suspected) exposure to covid-19: Secondary | ICD-10-CM | POA: Insufficient documentation

## 2020-06-14 DIAGNOSIS — X58XXXA Exposure to other specified factors, initial encounter: Secondary | ICD-10-CM | POA: Insufficient documentation

## 2020-06-14 DIAGNOSIS — R42 Dizziness and giddiness: Secondary | ICD-10-CM | POA: Insufficient documentation

## 2020-06-14 DIAGNOSIS — T675XXA Heat exhaustion, unspecified, initial encounter: Secondary | ICD-10-CM | POA: Insufficient documentation

## 2020-06-14 LAB — DIFFERENTIAL
Abs Immature Granulocytes: 0.02 10*3/uL (ref 0.00–0.07)
Basophils Absolute: 0 10*3/uL (ref 0.0–0.1)
Basophils Relative: 1 %
Eosinophils Absolute: 0.2 10*3/uL (ref 0.0–0.5)
Eosinophils Relative: 3 %
Immature Granulocytes: 0 %
Lymphocytes Relative: 40 %
Lymphs Abs: 2.4 10*3/uL (ref 0.7–4.0)
Monocytes Absolute: 0.4 10*3/uL (ref 0.1–1.0)
Monocytes Relative: 6 %
Neutro Abs: 3.1 10*3/uL (ref 1.7–7.7)
Neutrophils Relative %: 50 %

## 2020-06-14 LAB — I-STAT CHEM 8, ED
BUN: 14 mg/dL (ref 6–20)
Calcium, Ion: 1.09 mmol/L — ABNORMAL LOW (ref 1.15–1.40)
Chloride: 103 mmol/L (ref 98–111)
Creatinine, Ser: 0.8 mg/dL (ref 0.61–1.24)
Glucose, Bld: 108 mg/dL — ABNORMAL HIGH (ref 70–99)
HCT: 43 % (ref 39.0–52.0)
Hemoglobin: 14.6 g/dL (ref 13.0–17.0)
Potassium: 3.5 mmol/L (ref 3.5–5.1)
Sodium: 140 mmol/L (ref 135–145)
TCO2: 27 mmol/L (ref 22–32)

## 2020-06-14 LAB — COMPREHENSIVE METABOLIC PANEL
ALT: 20 U/L (ref 0–44)
AST: 20 U/L (ref 15–41)
Albumin: 3.8 g/dL (ref 3.5–5.0)
Alkaline Phosphatase: 52 U/L (ref 38–126)
Anion gap: 9 (ref 5–15)
BUN: 13 mg/dL (ref 6–20)
CO2: 27 mmol/L (ref 22–32)
Calcium: 9.1 mg/dL (ref 8.9–10.3)
Chloride: 103 mmol/L (ref 98–111)
Creatinine, Ser: 0.87 mg/dL (ref 0.61–1.24)
GFR, Estimated: >60 mL/min (ref >60)
Glucose, Bld: 107 mg/dL — ABNORMAL HIGH (ref 70–99)
Potassium: 3.6 mmol/L (ref 3.5–5.1)
Sodium: 139 mmol/L (ref 135–145)
Total Bilirubin: 0.6 mg/dL (ref 0.3–1.2)
Total Protein: 7 g/dL (ref 6.5–8.1)

## 2020-06-14 LAB — PROTIME-INR
INR: 1 (ref 0.8–1.2)
Prothrombin Time: 13.4 seconds (ref 11.4–15.2)

## 2020-06-14 LAB — APTT: aPTT: 28 seconds (ref 24–36)

## 2020-06-14 LAB — CBC
HCT: 44.5 % (ref 39.0–52.0)
Hemoglobin: 15 g/dL (ref 13.0–17.0)
MCH: 32.3 pg (ref 26.0–34.0)
MCHC: 33.7 g/dL (ref 30.0–36.0)
MCV: 95.9 fL (ref 80.0–100.0)
Platelets: 211 10*3/uL (ref 150–400)
RBC: 4.64 MIL/uL (ref 4.22–5.81)
RDW: 13 % (ref 11.5–15.5)
WBC: 6.1 10*3/uL (ref 4.0–10.5)
nRBC: 0 % (ref 0.0–0.2)

## 2020-06-14 LAB — RESP PANEL BY RT-PCR (FLU A&B, COVID) ARPGX2
Influenza A by PCR: NEGATIVE
Influenza B by PCR: NEGATIVE
SARS Coronavirus 2 by RT PCR: NEGATIVE

## 2020-06-14 LAB — CBG MONITORING, ED: Glucose-Capillary: 101 mg/dL — ABNORMAL HIGH (ref 70–99)

## 2020-06-14 MED ORDER — SODIUM CHLORIDE 0.9 % IV SOLN: INTRAVENOUS | Status: DC

## 2020-06-14 MED ORDER — LACTATED RINGERS IV BOLUS: 1000.0000 mL | Freq: Once | INTRAVENOUS | Status: DC

## 2020-06-14 MED ORDER — SODIUM CHLORIDE 0.9% FLUSH
3.0000 mL | Freq: Once | INTRAVENOUS | Status: AC
  Administered 2020-06-14: 3 mL via INTRAVENOUS

## 2020-06-14 NOTE — Code Documentation (Signed)
Stroke Response Nurse Documentation Code Documentation  Houston Zapien is a 55 y.o. male arriving to [facility] ED via [facility] on 06/14/2020 with past medical hx of heat stroke, HTN on anti-hypertensive. Code stroke was activated by EMS. Patient from home where he was LKW at 1100 when he drove home from work. 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 Once at home he laid down and took a nap. His daughter woke him up around 1430 and noticed he was difficult to wake-up, had a facial droop, and weak on one side.  On No antithrombotic. Stroke team at the bedside on patient arrival. Labs drawn and patient cleared for CT by Dr. Anitra Lauth. Patient to CT with team. NIHSS 2, see documentation for details and code stroke times. Patient with Expressive aphasia  and dysarthria  on exam. The following imaging was completed: CT. Patient is not a candidate for tPA due to symptoms resolving and LKW before 1100 today.   Care/Plan: q2h NIHSS and VS Bedside handoff with ED RN Chloe.    Jaylyn Iyer L Amarii Amy  Rapid Response RN

## 2020-06-14 NOTE — ED Notes (Signed)
Pt was steady on his feet denies feeling dizzy or weak.

## 2020-06-14 NOTE — Plan of Care (Signed)
MRI and MRA reviewed, no stroke or LVO. Pt condition most likely related to heat stroke with encephalopathy. On IVF. Will defer EDP for heat stroke management. Neurology will sign off. Please call with questions. Thanks for the consult.  Marvel Plan, MD PhD Stroke Neurology 06/14/2020 6:20 PM

## 2020-06-14 NOTE — Consult Note (Signed)
Stroke Neurology Consultation Note  Consult Requested by: Dr. Maryan Rued  Reason for Consult: code stroke  Consult Date: 06/14/20   The history was obtained from the EMS and pt.  During history and examination, all items were able to obtain unless otherwise noted.  History of Present Illness:  Larry Patel is a 55 y.o. Caucasian male with PMH of HTN and GERD and hx of heat strokes presented to ED for code stroke  Per EMS and patient, he went to work this morning at baseline.  However, he works in a very hot environment without [facility] and he had several strokes in the past.  Today, at work, he again felt hot, exhausted and generalized weakness, similar to his previous he stroke symptoms.  He went home at 11 AM ahead of his scheduled leave time, not feeling well.  His son saw him at home on arrival, he was groggy.  He went to sleep and hope getting better up to sleep.  Her daughter came home at 3 PM, found him hard to be aroused. When aroused, patient was found to have slurred speech, slow responding, discoordination and imbalance.  EMS was called, on arrival, family felt pt already getting better but still lethargic, slow with commands, shaky on finger-to-nose testing.  BP 130/80, glucose 146.  I met patient at bridge, EMS said patient already much improved during transportation, no significant focal findings, awake alert, follow commands no aphasia.  CT no acute abnormality.  Recheck glucose 101.  LSN: at least 11am tPA Given: No: Outside window, likely not stroke  No past medical history on file.   No family history on file.  Social History:  has no history on file for tobacco use, alcohol use, and drug use.  Allergies: Not on File  No current facility-administered medications on file prior to encounter.   No current outpatient medications on file prior to encounter.    Review of Systems: A full ROS was attempted today and was  able to be performed.  Systems assessed include -  Constitutional, Eyes, HENT, Respiratory, Cardiovascular, Gastrointestinal, Genitourinary, Integument/breast, Hematologic/lymphatic, Musculoskeletal, Neurological, Behavioral/Psych, Endocrine, Allergic/Immunologic - with pertinent responses as per HPI.  Physical Examination: BP: (131)/(80) 131/80 (06/14 1630) Weight:  [96 kg] 96 kg (06/14 1600)  General - well nourished, well developed, in no apparent distress.    Ophthalmologic - fundi not visualized due to noncooperation.    Cardiovascular - regular rhythm and rate  Mental Status -  Level of arousal and orientation to time, place, and person were intact. Language including expression, naming, repetition, comprehension, reading, and writing was assessed and found intact.  Cranial Nerves II - XII - II - Vision intact OU. III, IV, VI - Extraocular movements intact. V - Facial sensation intact bilaterally. VII - Facial movement intact bilaterally. VIII - Hearing & vestibular intact bilaterally. X - Palate elevates symmetrically. XI - Chin turning & shoulder shrug intact bilaterally. XII - Tongue protrusion intact.  Motor Strength - The patient's strength was normal in all extremities and pronator drift was absent.   Motor Tone & Bulk - Muscle tone was assessed at the neck and appendages and was normal.  Bulk was normal and fasciculations were absent.   Reflexes - The patient's reflexes were normal in all extremities and he had no pathological reflexes.  Sensory - Light touch, temperature/pinprick were assessed and were normal.    Coordination - The patient had normal movements in the feet with no ataxia or dysmetria.  Slight dysmetria  on FTN bilaterally, but nearly corrected with faster movement, which is different from intention tremor.   Gait and Station - deferred  NIH Stroke Scale  Level Of Consciousness 0=Alert; keenly responsive 1=Arouse to minor stimulation 2=Requires repeated stimulation to arouse or movements to  pain 3=postures or unresponsive 0  LOC Questions to Month and Age 36=Answers both questions correctly 1=Answers one question correctly or dysarthria/intubated/trauma/language barrier 2=Answers neither question correctly or aphasia 0  LOC Commands      -Open/Close eyes     -Open/close grip     -Pantomime commands if communication barrier 0=Performs both tasks correctly 1=Performs one task correctly 2=Performs neighter task correctly 0  Best Gaze     -Only assess horizontal gaze 0=Normal 1=Partial gaze palsy 2=Forced deviation, or total gaze paresis 0  Visual 0=No visual loss 1=Partial hemianopia 2=Complete hemianopia 3=Bilateral hemianopia (blind including cortical blindness) 0  Facial Palsy     -Use grimace if obtunded 0=Normal symmetrical movement 1=Minor paralysis (asymmetry) 2=Partial paralysis (lower face) 3=Complete paralysis (upper and lower face) 0  Motor  0=No drift for 10/5 seconds 1=Drift, but does not hit bed 2=Some antigravity effort, hits  bed 3=No effort against gravity, limb falls 4=No movement 0=Amputation/joint fusion Right Arm 0     Leg 0    Left Arm 0     Leg 0  Limb Ataxia     - FNT/HTS 0=Absent or does not understand or paralyzed or amputation/joint fusion 1=Present in one limb 2=Present in two limbs 0  Sensory 0=Normal 1=Mild to moderate sensory loss 2=Severe to total sensory loss or coma/unresponsive 0  Best Language 0=No aphasia, normal 1=Mild to moderate aphasia 2=Severe aphasia 3=Mute, global aphasia, or coma/unresponsive 0  Dysarthria 0=Normal 1=Mild to moderate 2=Severe, unintelligible or mute/anarthric 0=intubated/unable to test 0  Extinction/Neglect 0=No abnormality 1=visual/tactile/auditory/spatia/personal inattention/Extinction to bilateral simultaneous stimulation 2=Profound neglect/extinction more than 1 modality  0  Total   0     Data Reviewed: CT HEAD CODE STROKE WO CONTRAST  Result Date: 06/14/2020 CLINICAL DATA:  Code  stroke.  Slurred speech, weakness. EXAM: CT HEAD WITHOUT CONTRAST TECHNIQUE: Contiguous axial images were obtained from the base of the skull through the vertex without intravenous contrast. COMPARISON:  None. FINDINGS: Brain: No evidence of acute large vascular territory infarction, acute hemorrhage, hydrocephalus, extra-axial collection or mass lesion/mass effect. Vascular: No hyperdense vessel identified. Skull: No acute fracture. Sinuses/Orbits: Visualized sinuses are clear.  Remarkable orbits. Other: No mastoid effusions. ASPECTS [facility] Stroke Program Early CT Score) total score (0-10 with 10 being normal): Marland Kitchen IMPRESSION: No evidence of acute intracranial abnormality.  ASPECTS is 10 Code stroke imaging results were communicated on 06/14/2020 at 4:23 pm to provider Dr. Erlinda Hong Via secure text paging. Electronically Signed   By: Margaretha Sheffield MD   On: 06/14/2020 16:24    Assessment: 55 y.o. male with PMH of HTN, GERD and hx of heat strokes presented to ED for exhaustion, generalized weakness at work with extremely hot environment.  Went home slept and found to have difficulty arousal, slurred speech, slow responding, discoordination and imbalance.  Time onset at least 11 AM, NIHSS = 0. CT no acute abnormality.  Patient not a tPA candidate given outside window and likely not stroke.  Not IR candidate given low NIH score and likely not stroke.  Etiology for patient's symptoms most likely heat strokes with encephalopathy.  Symptoms improving.  Recommend IV fluid and history of management per EDP.  Recommend MRI brain and MRA head to rule  out stroke.   Plan: - MRI, MRA  of the brain without contrast - IVF and he stroke management per EDP - Telemetry monitoring - Frequent neuro checks - will follow  Thank you for this consultation and allowing [place] to participate in the care of this patient.  Rosalin Hawking, MD PhD Stroke Neurology 06/14/2020 4:46 PM

## 2020-06-14 NOTE — ED Provider Notes (Signed)
[facility] EMERGENCY DEPARTMENT Provider Note   CSN: [id number] Arrival date & time: 06/14/20  2202     History No chief complaint on file.   Larry Patel is a 55 y.o. male.  Patient is a 55 year old male with prior history of heatstroke who is presenting today as a code stroke.  The report is patient went to work this morning feeling in his normal state of health.  At his work there is no air conditioning and around 11:00 it was very hot in the building and he started not feeling well.  He decided to go home.  He was able to drive home but was feeling generally weak, nauseated and dizzy.  His son saw him walk in the house at noon and he went straight to bed.  His daughter came home and tried to wake him up around 3 PM and had significant difficulty waking him up.  When he did wake up he was confused.  EMS got to his house at 3:30 PM and reported that he was ataxic, had a weak smile, slurred speech, confusion and ataxia.  All of the symptoms have improved on his way here.  He had normal vital signs upon their arrival and a normal blood sugar.  Patient denies any chest pain, shortness of breath but reports feeling generally weak.  He has had no vomiting or diarrhea.  He denies any abdominal pain.  The history is provided by the patient and the EMS personnel.      No past medical history on file.  There are no problems to display for this patient.    No family history on file.     Home Medications Prior to Admission medications   Not on File    Allergies    Patient has no allergy information on record.  Review of Systems   Review of Systems  All other systems reviewed and are negative.  Physical Exam Updated Vital Signs BP 131/80   Pulse 64   Resp 19   Ht 5\' 9"  (1.753 m)   Wt 96 kg   SpO2 97%   BMI 31.25 kg/m   Physical Exam Vitals and nursing note reviewed.  Constitutional:      General: He is not in acute distress.    Appearance: He is  well-developed.  HENT:     Head: Normocephalic and atraumatic.  Eyes:     Conjunctiva/sclera: Conjunctivae normal.     Pupils: Pupils are equal, round, and reactive to light.  Cardiovascular:     Rate and Rhythm: Normal rate and regular rhythm.     Heart sounds: No murmur heard. Pulmonary:     Effort: Pulmonary effort is normal. No respiratory distress.     Breath sounds: Normal breath sounds. No wheezing or rales.  Abdominal:     General: There is no distension.     Palpations: Abdomen is soft.     Tenderness: There is no abdominal tenderness. There is no guarding or rebound.  Musculoskeletal:        General: No tenderness. Normal range of motion.     Cervical back: Normal range of motion and neck supple.     Right lower leg: No edema.     Left lower leg: No edema.  Skin:    General: Skin is warm and dry.     Findings: No erythema or rash.  Neurological:     General: No focal deficit present.     Mental Status: He  is alert and oriented to person, place, and time.     Cranial Nerves: Cranial nerves are intact. No dysarthria or facial asymmetry.     Sensory: Sensation is intact.     Motor: Motor function is intact. No weakness or pronator drift.     Comments: Slightly slow with speaking but no facial asymmetry.  Gait initially not tested due to complaints of feeling generally weak  Psychiatric:        Mood and Affect: Mood normal.        Behavior: Behavior normal.        Thought Content: Thought content normal.    ED Results / Procedures / Treatments   Labs (all labs ordered are listed, but only abnormal results are displayed) Labs Reviewed  COMPREHENSIVE METABOLIC PANEL - Abnormal; Notable for the following components:      Result Value   Glucose, Bld 107 (*)    All other components within normal limits  I-STAT CHEM 8, ED - Abnormal; Notable for the following components:   Glucose, Bld 108 (*)    Calcium, Ion 1.09 (*)    All other components within normal limits  CBG  MONITORING, ED - Abnormal; Notable for the following components:   Glucose-Capillary 101 (*)    All other components within normal limits  RESP PANEL BY RT-PCR (FLU A&B, COVID) ARPGX2  PROTIME-INR  APTT  CBC  DIFFERENTIAL    EKG EKG Interpretation  Date/Time:  Tuesday June 14 2020 16:37:50 EDT Ventricular Rate:  65 PR Interval:  148 QRS Duration: 95 QT Interval:  393 QTC Calculation: 409 R Axis:   -9 Text Interpretation: Sinus rhythm RSR' in V1 or V2, right VCD or RVH Confirmed by Gwyneth Sprout ([id number]) on 06/14/2020 5:06:48 PM  Radiology MR ANGIO HEAD WO CONTRAST  Result Date: 06/14/2020 CLINICAL DATA:  TIA. EXAM: MRI HEAD WITHOUT CONTRAST MRA HEAD WITHOUT CONTRAST TECHNIQUE: Multiplanar, multi-echo pulse sequences of the brain and surrounding structures were acquired without intravenous contrast. Angiographic images of the Circle of Willis were acquired using MRA technique without intravenous contrast. COMPARISON: No pertinent prior exam. COMPARISON:  No pertinent prior exam. FINDINGS: MRI HEAD FINDINGS Motion limited study despite multiple attempts. Within this limitation: Brain: No acute infarction, hemorrhage, hydrocephalus, extra-axial collection or mass lesion. Vascular: Major arterial flow voids are maintained at the skull base. Skull and upper cervical spine: Normal marrow signal. Sinuses/Orbits: Clear sinuses.  Unremarkable orbits. Other: No mastoid effusions. MRA HEAD FINDINGS Anterior circulation: No large vessel occlusion or proximal hemodynamically significant stenosis. No aneurysm. Posterior circulation: Bilateral intradural vertebral arteries, basilar artery and posterior cerebral arteries are patent without evidence of proximal hemodynamically significant stenosis. No aneurysm. IMPRESSION: MRI: No evidence of acute intracranial abnormality on this motion limited study. MRA: No large vessel occlusion or proximal hemodynamically significant stenosis. Electronically Signed    By: Feliberto Harts MD   On: 06/14/2020 18:14   MR BRAIN WO CONTRAST  Result Date: 06/14/2020 CLINICAL DATA:  TIA. EXAM: MRI HEAD WITHOUT CONTRAST MRA HEAD WITHOUT CONTRAST TECHNIQUE: Multiplanar, multi-echo pulse sequences of the brain and surrounding structures were acquired without intravenous contrast. Angiographic images of the Circle of Willis were acquired using MRA technique without intravenous contrast. COMPARISON: No pertinent prior exam. COMPARISON:  No pertinent prior exam. FINDINGS: MRI HEAD FINDINGS Motion limited study despite multiple attempts. Within this limitation: Brain: No acute infarction, hemorrhage, hydrocephalus, extra-axial collection or mass lesion. Vascular: Major arterial flow voids are maintained at the skull base. Skull and  upper cervical spine: Normal marrow signal. Sinuses/Orbits: Clear sinuses.  Unremarkable orbits. Other: No mastoid effusions. MRA HEAD FINDINGS Anterior circulation: No large vessel occlusion or proximal hemodynamically significant stenosis. No aneurysm. Posterior circulation: Bilateral intradural vertebral arteries, basilar artery and posterior cerebral arteries are patent without evidence of proximal hemodynamically significant stenosis. No aneurysm. IMPRESSION: MRI: No evidence of acute intracranial abnormality on this motion limited study. MRA: No large vessel occlusion or proximal hemodynamically significant stenosis. Electronically Signed   By: Feliberto Harts MD   On: 06/14/2020 18:14   CT HEAD CODE STROKE WO CONTRAST  Result Date: 06/14/2020 CLINICAL DATA:  Code stroke.  Slurred speech, weakness. EXAM: CT HEAD WITHOUT CONTRAST TECHNIQUE: Contiguous axial images were obtained from the base of the skull through the vertex without intravenous contrast. COMPARISON:  None. FINDINGS: Brain: No evidence of acute large vascular territory infarction, acute hemorrhage, hydrocephalus, extra-axial collection or mass lesion/mass effect. Vascular: No  hyperdense vessel identified. Skull: No acute fracture. Sinuses/Orbits: Visualized sinuses are clear.  Remarkable orbits. Other: No mastoid effusions. ASPECTS [facility] Stroke Program Early CT Score) total score (0-10 with 10 being normal): Marland Kitchen IMPRESSION: No evidence of acute intracranial abnormality.  ASPECTS is 10 Code stroke imaging results were communicated on 06/14/2020 at 4:23 pm to provider Dr. Roda Shutters Via secure text paging. Electronically Signed   By: Feliberto Harts MD   On: 06/14/2020 16:24    Procedures Procedures   Medications Ordered in ED Medications  sodium chloride flush (NS) 0.9 % injection 3 mL (has no administration in time range)    ED Course  I have reviewed the triage vital signs and the nursing notes.  Pertinent labs & imaging results that were available during my care of the patient were reviewed by me and considered in my medical decision making (see chart for details).    MDM Rules/Calculators/A&P                          Patient is a 55 year old male presenting today as a code stroke.  He was last seen normal around noon.  More global deficits upon EMS arrival.  Some ataxia with finger-to-nose and walking.  Patient's vital signs were normal and blood sugar within normal limits.  However also patient reports getting extremely hot today because his work has no air conditioning.  He did go home as a 11 AM but was not feeling well when he returned home.  He has prior history of heat exhaustion.  His airway was intact upon arrival.  He went directly to CT scanner and neurology was at bedside.  He denies any chest pain or abdominal pain.  Low suspicion for ACS, dissection.  Suspect most likely heat exhaustion versus stroke.  Patient given IV fluids and he continues to improve.  We will check to ensure patient does not have AKI. Dr. Roda Shutters with neurology feels like pt most likely having heat exhaustion.  Recommended MRI/MRA and ongoing checks.  Per pt's wife who is now at baseline  reports pt is back to his baseline.  6:44 PM Patient's MRI/MRA is negative and neurology has signed off.  On repeat evaluation patient is feeling back to himself just feels tired.  He was ambulated without any difficulty.  He will be discharged home with his wife.  MDM   Amount and/or Complexity of Data Reviewed Clinical lab tests: ordered and reviewed Tests in the radiology section of CPT: ordered and reviewed Tests in the medicine  section of CPT: ordered and reviewed Independent visualization of images, tracings, or specimens: yes  Risk of Complications, Morbidity, and/or Mortality Presenting problems: high Diagnostic procedures: moderate Management options: moderate  Patient Progress Patient progress: improved    Final Clinical Impression(s) / ED Diagnoses Final diagnoses:  Heat exhaustion, initial encounter    Rx / DC Orders ED Discharge Orders     None        Gwyneth SproutPlunkett, Lee Kalt, MD 06/14/20 1944

## 2020-06-14 NOTE — ED Notes (Signed)
NIH IS NOT DONE AT 1800 BC PT IS STILL IN MRI.

## 2020-06-14 NOTE — ED Triage Notes (Signed)
Pt bib [facility] from home as a code stroke. Per ems, pt came home from work around 11am this morning. Pt experienced weakness, dizziness and nausea on his way back home. Pt took a nap after got home,and woke up around 1330 with confusion and facial droop. Symptoms are resolved upon arrival to the hospital.   CBG 101 Bp 131/80

## 2020-06-15 ENCOUNTER — Encounter: Payer: Self-pay | Admitting: Nurse Practitioner

## 2020-07-05 ENCOUNTER — Other Ambulatory Visit: Payer: Self-pay

## 2020-07-05 DIAGNOSIS — I10 Essential (primary) hypertension: Secondary | ICD-10-CM

## 2020-07-05 MED ORDER — LOSARTAN POTASSIUM 50 MG PO TABS: 50.0000 mg | ORAL_TABLET | Freq: Every day | ORAL | 0 refills | Status: DC

## 2020-07-19 ENCOUNTER — Other Ambulatory Visit: Payer: Self-pay

## 2020-07-19 ENCOUNTER — Ambulatory Visit (INDEPENDENT_AMBULATORY_CARE_PROVIDER_SITE_OTHER): Payer: BC Managed Care – PPO | Admitting: Nurse Practitioner

## 2020-07-19 ENCOUNTER — Encounter: Payer: Self-pay | Admitting: Nurse Practitioner

## 2020-07-19 VITALS — BP 130/83 | HR 86 | Temp 97.4°F | Ht 69.0 in | Wt 209.0 lb

## 2020-07-19 DIAGNOSIS — Z029 Encounter for administrative examinations, unspecified: Secondary | ICD-10-CM

## 2020-07-19 NOTE — Assessment & Plan Note (Addendum)
All paperwork completed and signed.  Patient is cleared to drive

## 2020-07-19 NOTE — Progress Notes (Signed)
Established Patient Office Visit  Subjective:  Patient ID: Larry Patel, male    DOB: [birth year]  Age: 55 y.o. MRN: [id number]  CC:  Chief Complaint  Patient presents with   Administrative encounter    HPI Larry Patel presents for administrative encounter after heat exhaustion incidents.  Patient was recently evaluated in the emergency department and discharged after stabilization.  Patient now returning back to work and needs to be cleared medically for driving/safety  Past Medical History:  Diagnosis Date   GERD (gastroesophageal reflux disease)    Hypertension     Past Surgical History:  Procedure Laterality Date   SCAR REVISION Right     Family History  Problem Relation Age of Onset   Healthy Mother    Hypertension Father    Healthy Sister    Diabetes Maternal Aunt     Social History   Socioeconomic History   Marital status: Married    Spouse name: Not on file   Number of children: Not on file   Years of education: Not on file   Highest education level: Not on file  Occupational History   Not on file  Tobacco Use   Smoking status: Never   Smokeless tobacco: Never  Vaping Use   Vaping Use: Never used  Substance and Sexual Activity   Alcohol use: Not on file    Comment: occa   Drug use: Never   Sexual activity: Not on file  Other Topics Concern   Not on file  Social History Narrative   ** Merged History Encounter **       Social Determinants of Health   Financial Resource Strain: Not on file  Food Insecurity: Not on file  Transportation Needs: Not on file  Physical Activity: Not on file  Stress: Not on file  Social Connections: Not on file  Intimate Partner Violence: Not on file    Outpatient Medications Prior to Visit  Medication Sig Dispense Refill   losartan (COZAAR) 50 MG tablet Take 1 tablet (50 mg total) by mouth daily. 90 tablet 0   omeprazole (PRILOSEC) 40 MG capsule TAKE 1 CAPSULE BY MOUTH EVERY DAY 90 capsule 1   Azelastine HCl  137 MCG/SPRAY SOLN Place 2 sprays into both nostrils 2 (two) times daily.     cefadroxil (DURICEF) 500 MG capsule Take 500 mg by mouth 2 (two) times daily.     hydrOXYzine (ATARAX/VISTARIL) 25 MG tablet Take 25 mg by mouth 4 (four) times daily.     omeprazole (PRILOSEC) 40 MG capsule Take 40 mg by mouth daily.     No facility-administered medications prior to visit.    No Known Allergies  ROS Review of Systems  Constitutional: Negative.   HENT: Negative.    Respiratory: Negative.    Cardiovascular: Negative.   All other systems reviewed and are negative.    Objective:    Physical Exam Vitals and nursing note reviewed.  Constitutional:      Appearance: Normal appearance.  HENT:     Head: Normocephalic.  Eyes:     Conjunctiva/sclera: Conjunctivae normal.  Pulmonary:     Effort: Pulmonary effort is normal.     Breath sounds: Normal breath sounds.  Skin:    Findings: No rash.  Neurological:     Mental Status: He is alert and oriented to person, place, and time.  Psychiatric:        Behavior: Behavior normal.    BP 130/83   Pulse 86   Temp Marland Kitchen)  97.4 F (36.3 C) (Temporal)   Ht 5\' 9"  (1.753 m)   Wt 209 lb (94.8 kg)   SpO2 99%   BMI 30.86 kg/m  Wt Readings from Last 3 Encounters:  07/19/20 209 lb (94.8 kg)  06/14/20 211 lb 10.3 oz (96 kg)  04/20/20 209 lb (94.8 kg)     Health Maintenance Due  Topic Date Due   COLONOSCOPY (Pts 45-60yrs Insurance coverage will need to be confirmed)  Never done   Zoster Vaccines- Shingrix (1 of 2) Never done   TETANUS/TDAP  01/01/2018   COVID-19 Vaccine (2 - Moderna series) 01/21/2020    There are no preventive care reminders to display for this patient.  Lab Results  Component Value Date   TSH 2.290 07/28/2015   Lab Results  Component Value Date   WBC 6.1 06/14/2020   HGB 14.6 06/14/2020   HCT 43.0 06/14/2020   MCV 95.9 06/14/2020   PLT 211 06/14/2020   Lab Results  Component Value Date   NA 140 06/14/2020   K  3.5 06/14/2020   CO2 27 06/14/2020   GLUCOSE 108 (H) 06/14/2020   BUN 14 06/14/2020   CREATININE 0.80 06/14/2020   BILITOT 0.6 06/14/2020   ALKPHOS 52 06/14/2020   AST 20 06/14/2020   ALT 20 06/14/2020   PROT 7.0 06/14/2020   ALBUMIN 3.8 06/14/2020   CALCIUM 9.1 06/14/2020   ANIONGAP 9 06/14/2020   Lab Results  Component Value Date   CHOL 195 01/11/2020   Lab Results  Component Value Date   HDL 78 01/11/2020   Lab Results  Component Value Date   LDLCALC 95 01/11/2020   Lab Results  Component Value Date   TRIG 126 01/11/2020   Lab Results  Component Value Date   CHOLHDL 2.5 01/11/2020   No results found for: HGBA1C    Assessment & Plan:   Problem List Items Addressed This Visit       Other   Administrative encounter - Primary    All paperwork completed and signed.  Patient is cleared to drive        No orders of the defined types were placed in this encounter.   Follow-up: Return if symptoms worsen or fail to improve.    03/10/2020, NP

## 2020-10-02 ENCOUNTER — Other Ambulatory Visit: Payer: Self-pay | Admitting: Nurse Practitioner

## 2020-10-02 DIAGNOSIS — I10 Essential (primary) hypertension: Secondary | ICD-10-CM

## 2020-11-02 ENCOUNTER — Other Ambulatory Visit: Payer: Self-pay | Admitting: Nurse Practitioner

## 2020-11-02 DIAGNOSIS — I10 Essential (primary) hypertension: Secondary | ICD-10-CM

## 2020-11-02 NOTE — Telephone Encounter (Signed)
Je NTBS 30 days given 10/03/20

## 2020-11-08 ENCOUNTER — Ambulatory Visit (INDEPENDENT_AMBULATORY_CARE_PROVIDER_SITE_OTHER): Payer: BC Managed Care – PPO | Admitting: Nurse Practitioner

## 2020-11-08 ENCOUNTER — Other Ambulatory Visit: Payer: Self-pay

## 2020-11-08 ENCOUNTER — Encounter: Payer: Self-pay | Admitting: Nurse Practitioner

## 2020-11-08 VITALS — BP 146/93 | HR 78 | Temp 98.3°F | Resp 20 | Ht 69.0 in | Wt 213.0 lb

## 2020-11-08 DIAGNOSIS — I10 Essential (primary) hypertension: Secondary | ICD-10-CM | POA: Diagnosis not present

## 2020-11-08 MED ORDER — LOSARTAN POTASSIUM 50 MG PO TABS: 50.0000 mg | ORAL_TABLET | Freq: Every day | ORAL | 5 refills | Status: DC

## 2020-11-08 NOTE — Assessment & Plan Note (Signed)
patient reports BP is elevated because he is in clinic and his numbers are perfectly fine at home, patient has no BP log with him on this visit.  Advised to monitor BP , keep a log, call in values in 1 week. continue current dose, low sodium diet.    Follow up in 3 months.

## 2020-11-08 NOTE — Progress Notes (Signed)
Established Patient Office Visit  Subjective:  Patient ID: Larry Patel, male    DOB: July 02, 1965  Age: 55 y.o. MRN: [id number]  CC:  Chief Complaint  Patient presents with   Medication Refill    HPI Larry Patel presents for Pt presents for follow up of hypertension. Patient was diagnosed in 2014. The patient is tolerating the medication well without side effects. Compliance with treatment has been good; including taking medication as directed , maintains a healthy diet and regular exercise regimen , and following up as directed. Current medication Losartan 50 mg tablet by mouth daily  Past Medical History:  Diagnosis Date   GERD (gastroesophageal reflux disease)    Hypertension     Past Surgical History:  Procedure Laterality Date   SCAR REVISION Right     Family History  Problem Relation Age of Onset   Healthy Mother    Hypertension Father    Healthy Sister    Diabetes Maternal Aunt     Social History   Socioeconomic History   Marital status: Married    Spouse name: Not on file   Number of children: Not on file   Years of education: Not on file   Highest education level: Not on file  Occupational History   Not on file  Tobacco Use   Smoking status: Never   Smokeless tobacco: Never  Vaping Use   Vaping Use: Never used  Substance and Sexual Activity   Alcohol use: Not on file    Comment: occa   Drug use: Never   Sexual activity: Not on file  Other Topics Concern   Not on file  Social History Narrative   ** Merged History Encounter **       Social Determinants of Health   Financial Resource Strain: Not on file  Food Insecurity: Not on file  Transportation Needs: Not on file  Physical Activity: Not on file  Stress: Not on file  Social Connections: Not on file  Intimate Partner Violence: Not on file    Outpatient Medications Prior to Visit  Medication Sig Dispense Refill   Multiple Vitamins-Minerals (MULTIVITAMIN WITH MINERALS) tablet Take 1  tablet by mouth daily.     omeprazole (PRILOSEC) 40 MG capsule TAKE 1 CAPSULE BY MOUTH EVERY DAY 90 capsule 1   losartan (COZAAR) 50 MG tablet Take 1 tablet (50 mg total) by mouth daily. (NEEDS TO BE SEEN BEFORE NEXT REFILL) 30 tablet 0   No facility-administered medications prior to visit.    No Known Allergies  ROS Review of Systems  Constitutional: Negative.   HENT: Negative.    Eyes: Negative.   Respiratory: Negative.    Gastrointestinal: Negative.   Musculoskeletal: Negative.   Skin:  Negative for rash.  All other systems reviewed and are negative.    Objective:    Physical Exam Vitals and nursing note reviewed.  Constitutional:      Appearance: Normal appearance.  HENT:     Head: Normocephalic.     Right Ear: External ear normal.     Left Ear: External ear normal.     Mouth/Throat:     Mouth: Mucous membranes are moist.     Pharynx: Oropharynx is clear.  Eyes:     Conjunctiva/sclera: Conjunctivae normal.  Cardiovascular:     Rate and Rhythm: Normal rate and regular rhythm.     Pulses: Normal pulses.     Heart sounds: Normal heart sounds.  Pulmonary:     Effort: Pulmonary effort is  normal.     Breath sounds: Normal breath sounds.  Abdominal:     General: Bowel sounds are normal.  Skin:    General: Skin is warm.     Findings: No rash.  Neurological:     Mental Status: He is alert and oriented to person, place, and time.  Psychiatric:        Behavior: Behavior normal.    BP (!) 146/93   Pulse 78   Temp 98.3 F (36.8 C) (Temporal)   Resp 20   Ht 5\' 9"  (1.753 m)   Wt 213 lb (96.6 kg)   SpO2 95%   BMI 31.45 kg/m  Wt Readings from Last 3 Encounters:  11/08/20 213 lb (96.6 kg)  07/19/20 209 lb (94.8 kg)  06/14/20 211 lb 10.3 oz (96 kg)     Health Maintenance Due  Topic Date Due   COLONOSCOPY (Pts 45-50yrs Insurance coverage will need to be confirmed)  Never done   COVID-19 Vaccine (2 - Moderna series) 01/21/2020    There are no preventive  care reminders to display for this patient.  Lab Results  Component Value Date   TSH 2.290 07/28/2015   Lab Results  Component Value Date   WBC 6.1 06/14/2020   HGB 14.6 06/14/2020   HCT 43.0 06/14/2020   MCV 95.9 06/14/2020   PLT 211 06/14/2020   Lab Results  Component Value Date   NA 140 06/14/2020   K 3.5 06/14/2020   CO2 27 06/14/2020   GLUCOSE 108 (H) 06/14/2020   BUN 14 06/14/2020   CREATININE 0.80 06/14/2020   BILITOT 0.6 06/14/2020   ALKPHOS 52 06/14/2020   AST 20 06/14/2020   ALT 20 06/14/2020   PROT 7.0 06/14/2020   ALBUMIN 3.8 06/14/2020   CALCIUM 9.1 06/14/2020   ANIONGAP 9 06/14/2020   Lab Results  Component Value Date   CHOL 195 01/11/2020   Lab Results  Component Value Date   HDL 78 01/11/2020   Lab Results  Component Value Date   LDLCALC 95 01/11/2020   Lab Results  Component Value Date   TRIG 126 01/11/2020   Lab Results  Component Value Date   CHOLHDL 2.5 01/11/2020   No results found for: HGBA1C    Assessment & Plan:   Problem List Items Addressed This Visit       Cardiovascular and Mediastinum   Hypertension - Primary    patient reports BP is elevated because he is in clinic and his numbers are perfectly fine at home, patient has no BP log with him on this visit.  Advised to monitor BP , keep a log, call in values in 1 week. continue current dose, low sodium diet.    Follow up in 3 months.      Relevant Medications   losartan (COZAAR) 50 MG tablet    Meds ordered this encounter  Medications   losartan (COZAAR) 50 MG tablet    Sig: Take 1 tablet (50 mg total) by mouth daily. (NEEDS TO BE SEEN BEFORE NEXT REFILL)    Dispense:  30 tablet    Refill:  5    Order Specific Question:   Supervising Provider    Answer:   03/10/2020 Mechele Claude    Follow-up: Return in about 6 months (around 05/08/2021).    07/08/2021, NP

## 2020-11-08 NOTE — Patient Instructions (Signed)

## 2021-02-03 ENCOUNTER — Other Ambulatory Visit: Payer: Self-pay | Admitting: Nurse Practitioner

## 2021-02-03 DIAGNOSIS — K219 Gastro-esophageal reflux disease without esophagitis: Secondary | ICD-10-CM

## 2021-05-05 ENCOUNTER — Other Ambulatory Visit: Payer: Self-pay | Admitting: Nurse Practitioner

## 2021-05-05 DIAGNOSIS — I10 Essential (primary) hypertension: Secondary | ICD-10-CM

## 2021-08-10 ENCOUNTER — Other Ambulatory Visit: Payer: Self-pay | Admitting: Nurse Practitioner

## 2021-08-10 DIAGNOSIS — K219 Gastro-esophageal reflux disease without esophagitis: Secondary | ICD-10-CM

## 2021-08-18 IMAGING — CT CT HEAD CODE STROKE
4 series · 16 of 47 positions shown, 18 images · non-contrast
Comparison: None.

CLINICAL DATA: Code stroke.  Slurred speech, weakness.

EXAM:
CT HEAD WITHOUT CONTRAST
TECHNIQUE: Contiguous axial images were obtained from the base of the skull
through the vertex without intravenous contrast.

[Series 3: head wo · axial · 0.43mm/px · z∈[+1128,+1248]mm · 7 of 34 slices shown, 9 images]
[im 5/34  brain]
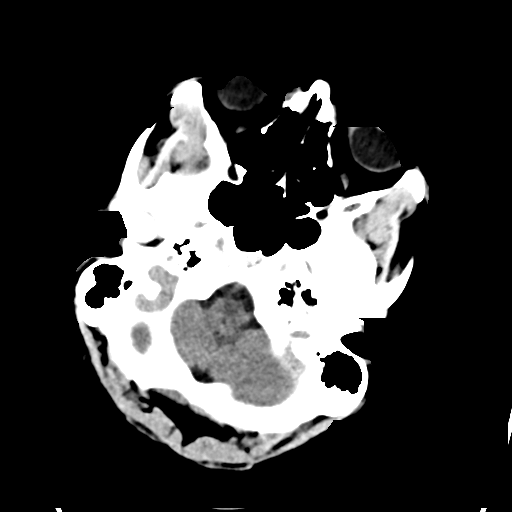
[im 5/34  bone]
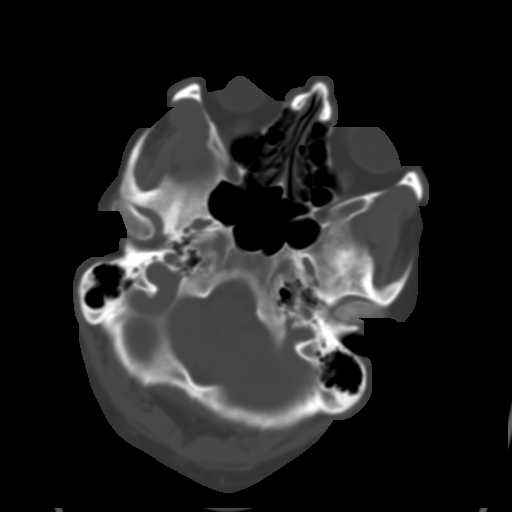
[im 9/34  brain]
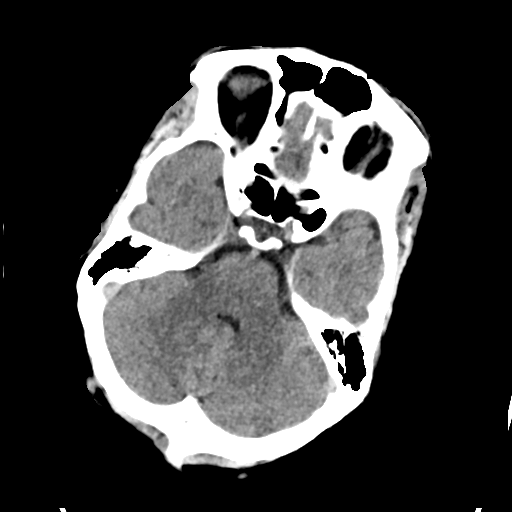
[im 13/34  brain]
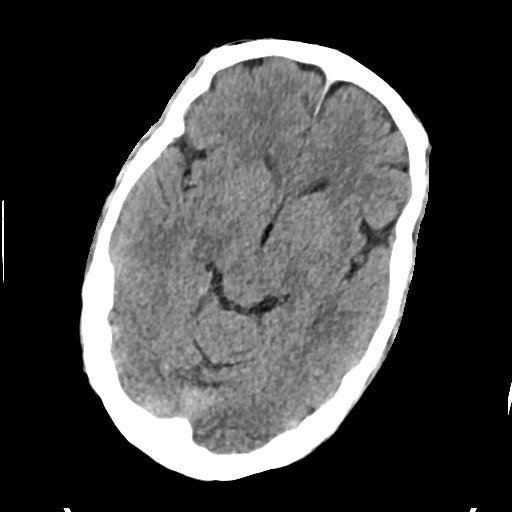
[im 17/34  brain]
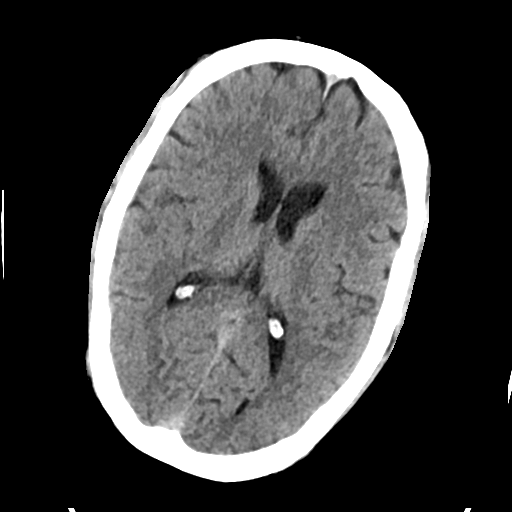
[im 21/34  brain]
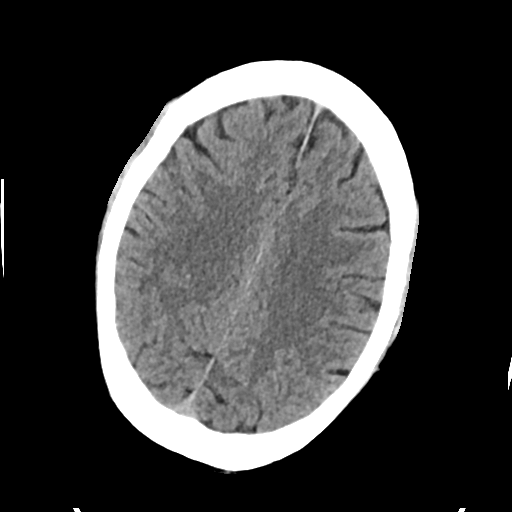
[im 21/34  bone]
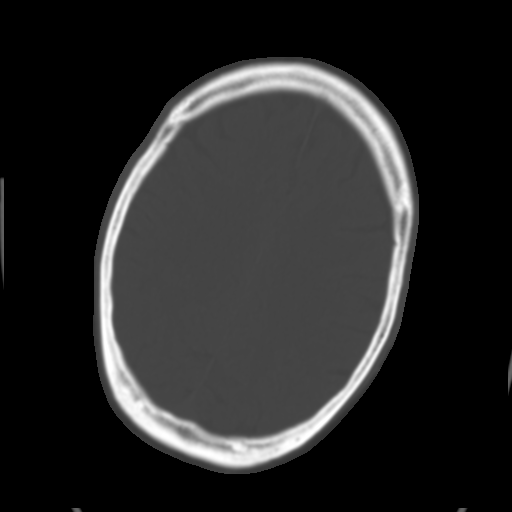
[im 25/34  brain]
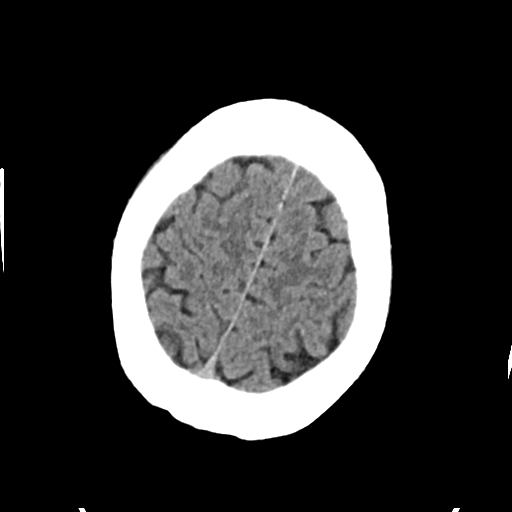
[im 29/34  brain]
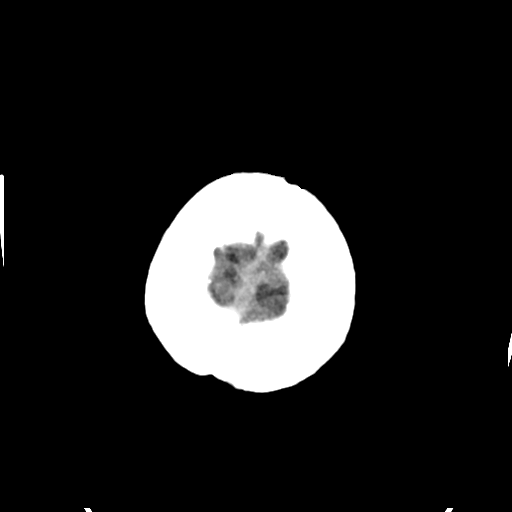

[Series 4: head bone · axial · 0.43mm/px · z∈[+1124,+1158]mm · 3 of 85 slices shown]
[im 9/85  bone]
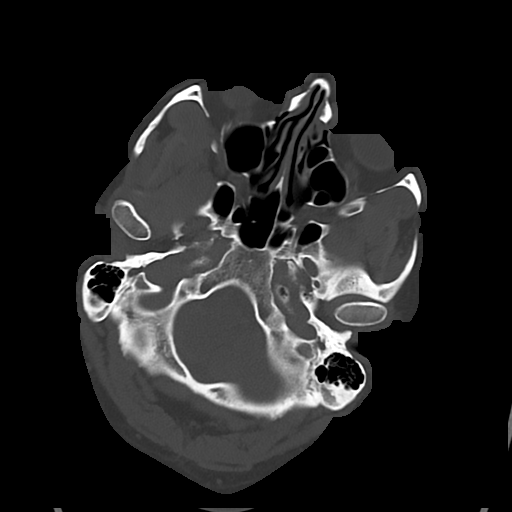
[im 17/85  bone]
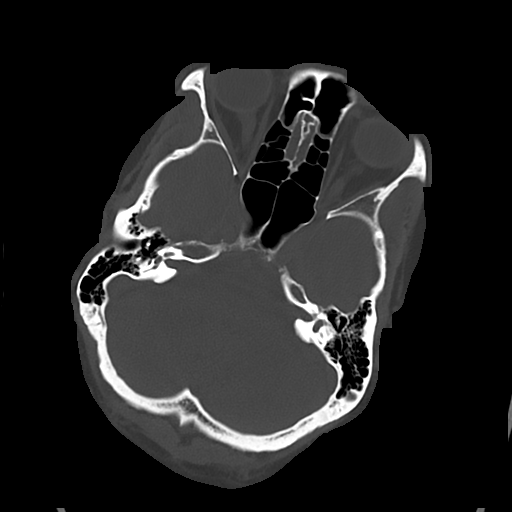
[im 26/85  bone]
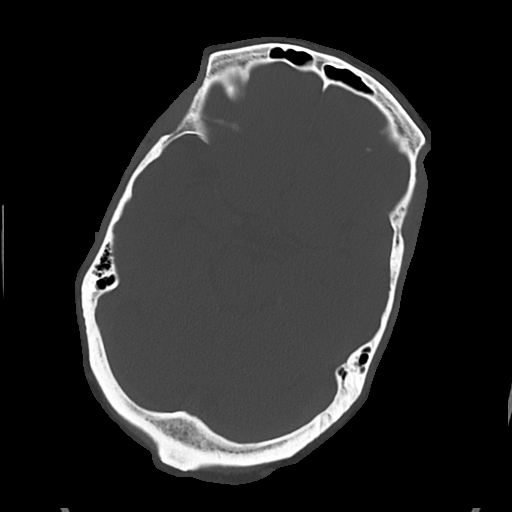

[Series 5: cor soft · coronal · 0.31mm/px · 3 of 68 slices shown]
[im 23/68  brain]
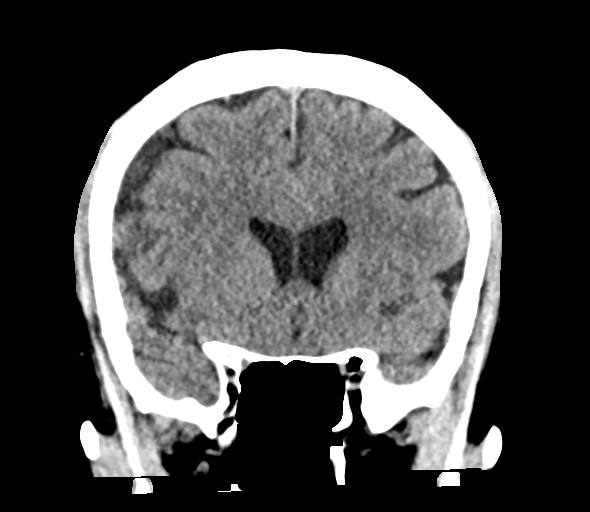
[im 30/68  brain]
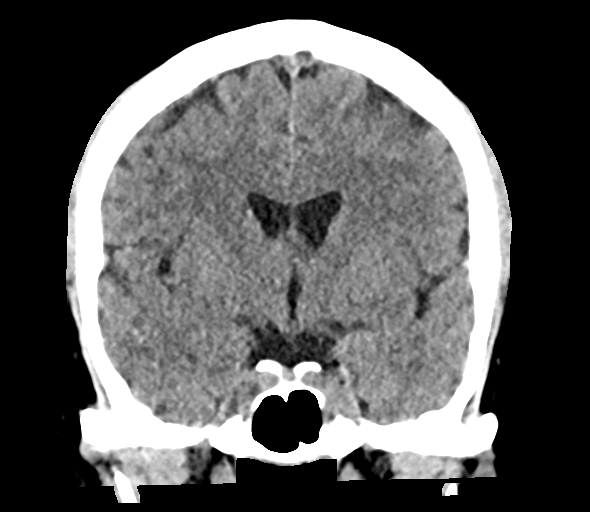
[im 38/68  brain]
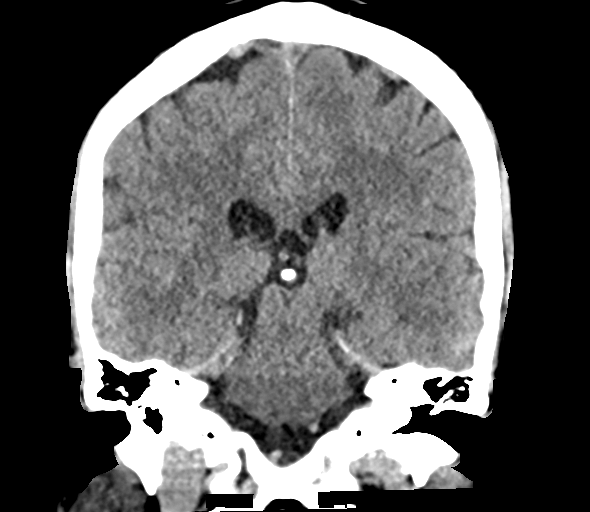

[Series 6: sag soft · sagittal · 0.31mm/px · 3 of 61 slices shown]
[im 21/61  brain]
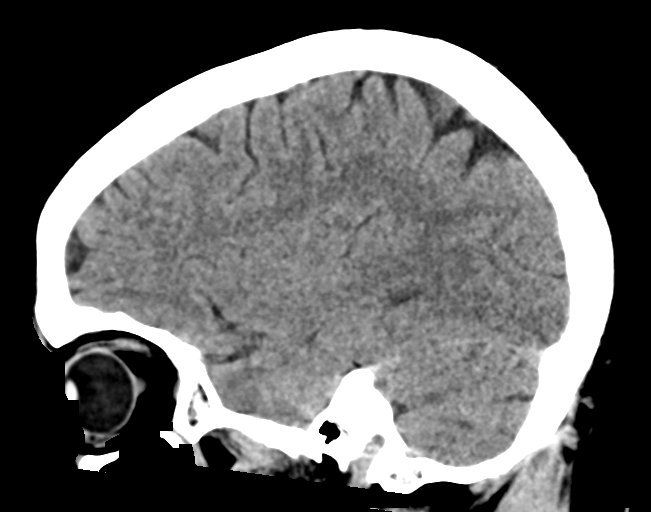
[im 31/61  brain]
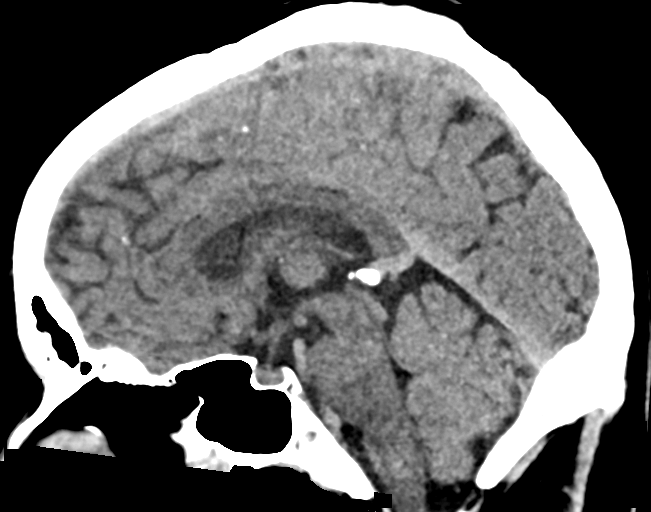
[im 41/61  brain]
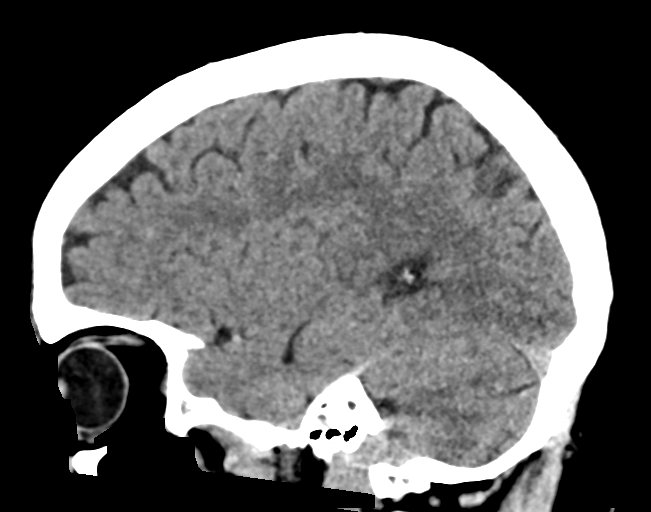

[16 of 47 positions shown; findings below may reference images not displayed]

FINDINGS: Brain: No evidence of acute large vascular territory infarction,
acute hemorrhage, hydrocephalus, extra-axial collection or mass
lesion/mass effect.

Vascular: No hyperdense vessel identified.

Skull: No acute fracture.

Sinuses/Orbits: Visualized sinuses are clear.  Remarkable orbits.

Other: No mastoid effusions.

ASPECTS ([facility] Stroke Program Early CT Score) total score (0-10
with 10 being normal): .
IMPRESSION: No evidence of acute intracranial abnormality.  ASPECTS is 10

Code stroke imaging results were communicated on 06/14/2020 at [DATE] to provider Dr. Racine Via secure text paging.

## 2021-09-06 ENCOUNTER — Other Ambulatory Visit: Payer: Self-pay | Admitting: Nurse Practitioner

## 2021-09-06 DIAGNOSIS — K219 Gastro-esophageal reflux disease without esophagitis: Secondary | ICD-10-CM

## 2021-10-04 ENCOUNTER — Other Ambulatory Visit: Payer: Self-pay | Admitting: Nurse Practitioner

## 2021-10-04 DIAGNOSIS — K219 Gastro-esophageal reflux disease without esophagitis: Secondary | ICD-10-CM

## 2021-10-31 ENCOUNTER — Other Ambulatory Visit: Payer: Self-pay | Admitting: Nurse Practitioner

## 2021-10-31 DIAGNOSIS — I10 Essential (primary) hypertension: Secondary | ICD-10-CM

## 2021-12-04 ENCOUNTER — Encounter: Payer: Self-pay | Admitting: Nurse Practitioner

## 2021-12-04 ENCOUNTER — Other Ambulatory Visit: Payer: Self-pay | Admitting: Nurse Practitioner

## 2021-12-04 DIAGNOSIS — I10 Essential (primary) hypertension: Secondary | ICD-10-CM

## 2021-12-04 NOTE — Telephone Encounter (Signed)
Je NTBS 30 days given 10/31/21

## 2021-12-04 NOTE — Telephone Encounter (Signed)
NA. LETTER MAILED to schedule an apt

## 2021-12-22 ENCOUNTER — Telehealth: Payer: Self-pay | Admitting: Nurse Practitioner

## 2021-12-22 NOTE — Telephone Encounter (Signed)
  Prescription Request  12/22/2021  Is this a "Controlled Substance" medicine? NO  Have you seen your PCP in the last 2 weeks? No pt has appt on 12/29/2021 but is out of medication  If YES, route message to pool  -  If NO, patient needs to be scheduled for appointment.  What is the name of the medication or equipment? omeprazole (PRILOSEC) 40 MG capsule  losartan (COZAAR) 50 MG tablet   Have you contacted your pharmacy to request a refill? yes   Which pharmacy would you like this sent to? [facility]   Patient notified that their request is being sent to the clinical staff for review and that they should receive a response within 2 business days.

## 2021-12-22 NOTE — Telephone Encounter (Signed)
I spoke to pt and advised him since he hasn't been seen in office in over 1 year we can't refill medications till he is seen in office and pt voiced understanding.

## 2021-12-29 ENCOUNTER — Ambulatory Visit: Payer: BC Managed Care – PPO | Admitting: Nurse Practitioner

## 2021-12-29 ENCOUNTER — Other Ambulatory Visit: Payer: Self-pay

## 2021-12-29 DIAGNOSIS — K219 Gastro-esophageal reflux disease without esophagitis: Secondary | ICD-10-CM

## 2021-12-29 DIAGNOSIS — I10 Essential (primary) hypertension: Secondary | ICD-10-CM

## 2021-12-29 MED ORDER — OMEPRAZOLE 40 MG PO CPDR: 40.0000 mg | DELAYED_RELEASE_CAPSULE | Freq: Every day | ORAL | 0 refills | Status: DC

## 2021-12-29 MED ORDER — LOSARTAN POTASSIUM 50 MG PO TABS: 50.0000 mg | ORAL_TABLET | Freq: Every day | ORAL | 0 refills | Status: DC

## 2022-02-02 ENCOUNTER — Ambulatory Visit: Payer: BC Managed Care – PPO | Admitting: Family Medicine

## 2022-02-02 ENCOUNTER — Encounter: Payer: Self-pay | Admitting: Family Medicine

## 2022-02-02 VITALS — BP 139/89 | HR 84 | Temp 98.1°F | Ht 69.0 in | Wt 219.1 lb

## 2022-02-02 DIAGNOSIS — Z0001 Encounter for general adult medical examination with abnormal findings: Secondary | ICD-10-CM

## 2022-02-02 DIAGNOSIS — I10 Essential (primary) hypertension: Secondary | ICD-10-CM | POA: Diagnosis not present

## 2022-02-02 DIAGNOSIS — Z1211 Encounter for screening for malignant neoplasm of colon: Secondary | ICD-10-CM

## 2022-02-02 DIAGNOSIS — J069 Acute upper respiratory infection, unspecified: Secondary | ICD-10-CM

## 2022-02-02 DIAGNOSIS — Z0000 Encounter for general adult medical examination without abnormal findings: Secondary | ICD-10-CM

## 2022-02-02 DIAGNOSIS — K219 Gastro-esophageal reflux disease without esophagitis: Secondary | ICD-10-CM | POA: Diagnosis not present

## 2022-02-02 DIAGNOSIS — R5383 Other fatigue: Secondary | ICD-10-CM

## 2022-02-02 MED ORDER — LOSARTAN POTASSIUM 50 MG PO TABS: 50.0000 mg | ORAL_TABLET | Freq: Every day | ORAL | 1 refills | Status: DC

## 2022-02-02 MED ORDER — OMEPRAZOLE 40 MG PO CPDR: 40.0000 mg | DELAYED_RELEASE_CAPSULE | Freq: Every day | ORAL | 1 refills | Status: DC

## 2022-02-02 NOTE — Progress Notes (Signed)
Established Patient Office Visit  Subjective   Patient ID: Larry Patel, male    DOB: 1965-06-24  Age: 57 y.o. MRN: [id number]  Chief Complaint  Patient presents with   Medical Management of Chronic Issues   Annual Exam    HPI Lum is here for a CPE. He reports doing well. He denies concerns today.    HTN Complaint with meds - Yes Current Medications - losartan 50 mg daily Pertinent ROS:  Headache - No Fatigue - No Visual Disturbances - No Chest pain - No Dyspnea - No Palpitations - No LE edema - No  2. GERD Compliant with medications - Yes Current medications - omeprazole 40 mg  Sore throat - No Voice change - No Hemoptysis - No Dysphagia or dyspepsia - No Water brash - No Red Flags (weight loss, hematochezia, melena, weight loss, early satiety, fevers, odynophagia, or persistent vomiting) - No  3. URI Ongoing symptoms x 2 weeks.This has been gradually improving and feels like its almost cleared up. Reports just some lingering mild cough and congestion.   4. Decreased energy Reports decreased stamina for last 2-3 years.   Past Medical History:  Diagnosis Date   GERD (gastroesophageal reflux disease)    Hypertension       ROS Negative unless specially indicated above in HPI.   Objective:     BP 139/89   Pulse 84   Temp 98.1 F (36.7 C) (Temporal)   Ht 5\' 9"  (1.753 m)   Wt 219 lb 2 oz (99.4 kg)   SpO2 96%   BMI 32.36 kg/m    Physical Exam Vitals and nursing note reviewed.  Constitutional:      General: He is not in acute distress.    Appearance: He is not ill-appearing.  HENT:     Head: Normocephalic.     Right Ear: Tympanic membrane, ear canal and external ear normal.     Left Ear: Tympanic membrane, ear canal and external ear normal.     Nose: Nose normal.     Mouth/Throat:     Mouth: Mucous membranes are moist.     Pharynx: Oropharynx is clear.  Eyes:     Extraocular Movements: Extraocular movements intact.     Conjunctiva/sclera:  Conjunctivae normal.     Pupils: Pupils are equal, round, and reactive to light.  Neck:     Thyroid: No thyroid mass, thyromegaly or thyroid tenderness.     Vascular: No carotid bruit.  Cardiovascular:     Rate and Rhythm: Normal rate and regular rhythm.     Pulses: Normal pulses.     Heart sounds: Normal heart sounds. No murmur heard.    No friction rub. No gallop.  Pulmonary:     Effort: Pulmonary effort is normal.     Breath sounds: Normal breath sounds.  Abdominal:     General: Bowel sounds are normal. There is no distension.     Palpations: Abdomen is soft. There is no mass.     Tenderness: There is no abdominal tenderness. There is no guarding.  Musculoskeletal:        General: No swelling.     Cervical back: Normal range of motion and neck supple. No tenderness.     Right lower leg: No edema.     Left lower leg: No edema.  Skin:    General: Skin is warm and dry.     Capillary Refill: Capillary refill takes less than 2 seconds.     Findings:  No lesion or rash.  Neurological:     General: No focal deficit present.     Mental Status: He is alert and oriented to person, place, and time.  Psychiatric:        Mood and Affect: Mood normal.        Behavior: Behavior normal.        Thought Content: Thought content normal.      No results found for any visits on 02/02/22.    The 10-year ASCVD risk score (Arnett DK, et al., 2019) is: 4.5%    Assessment & Plan:   Kaladin was seen today for medical management of chronic issues and annual exam.  Diagnoses and all orders for this visit:  Encounter for wellness examination in adult -     CMP14+EGFR; Future -     Lipid panel; Future -     TSH; Future -     CBC with Differential/Platelet; Future  Primary hypertension Well controlled on current regimen. Continue losartan.  -     losartan (COZAAR) 50 MG tablet; Take 1 tablet (50 mg total) by mouth daily. -     CMP14+EGFR; Future -     Lipid panel; Future -     TSH;  Future -     CBC with Differential/Platelet; Future  Gastroesophageal reflux disease without esophagitis Well controlled on current regimen.  -     omeprazole (PRILOSEC) 40 MG capsule; Take 1 capsule (40 mg total) by mouth daily.  Decreased stamina Labs pending.  -     CMP14+EGFR; Future -     TSH; Future -     CBC with Differential/Platelet; Future -     Testosterone,Free and Total; Future  URI, acute Improving. Discussed flonase OTC.   Colon cancer screening -     Cologuard  Return in about 6 months (around 08/03/2022) for chronic follow up.   The patient indicates understanding of these issues and agrees with the plan.  Gwenlyn Perking, FNP

## 2022-02-14 ENCOUNTER — Encounter: Payer: Self-pay | Admitting: Family Medicine

## 2022-02-14 ENCOUNTER — Other Ambulatory Visit: Payer: BC Managed Care – PPO

## 2022-02-14 DIAGNOSIS — R5383 Other fatigue: Secondary | ICD-10-CM

## 2022-02-14 DIAGNOSIS — Z0000 Encounter for general adult medical examination without abnormal findings: Secondary | ICD-10-CM

## 2022-02-14 DIAGNOSIS — I10 Essential (primary) hypertension: Secondary | ICD-10-CM

## 2022-02-14 LAB — LIPID PANEL

## 2022-02-14 LAB — CBC WITH DIFFERENTIAL/PLATELET

## 2022-02-21 LAB — CMP14+EGFR
ALT: 19 IU/L (ref 0–44)
AST: 16 IU/L (ref 0–40)
Albumin/Globulin Ratio: 1.5 (ref 1.2–2.2)
Albumin: 4.3 g/dL (ref 3.8–4.9)
Alkaline Phosphatase: 76 IU/L (ref 44–121)
BUN/Creatinine Ratio: 16 (ref 9–20)
BUN: 13 mg/dL (ref 6–24)
Bilirubin Total: 0.4 mg/dL (ref 0.0–1.2)
CO2: 23 mmol/L (ref 20–29)
Calcium: 9.2 mg/dL (ref 8.7–10.2)
Chloride: 103 mmol/L (ref 96–106)
Creatinine, Ser: 0.83 mg/dL (ref 0.76–1.27)
Globulin, Total: 2.8 g/dL (ref 1.5–4.5)
Glucose: 98 mg/dL (ref 70–99)
Potassium: 4.8 mmol/L (ref 3.5–5.2)
Sodium: 142 mmol/L (ref 134–144)
Total Protein: 7.1 g/dL (ref 6.0–8.5)
eGFR: 104 mL/min/{1.73_m2} (ref >59)

## 2022-02-21 LAB — CBC WITH DIFFERENTIAL/PLATELET
Basophils Absolute: 0 10*3/uL (ref 0.0–0.2)
Basos: 1 %
EOS (ABSOLUTE): 0.2 10*3/uL (ref 0.0–0.4)
Eos: 3 %
Hematocrit: 43.7 % (ref 37.5–51.0)
Hemoglobin: 14.6 g/dL (ref 13.0–17.7)
Immature Grans (Abs): 0 10*3/uL (ref 0.0–0.1)
Immature Granulocytes: 0 %
Lymphocytes Absolute: 1.9 10*3/uL (ref 0.7–3.1)
Lymphs: 30 %
MCH: 32 pg (ref 26.6–33.0)
MCHC: 33.4 g/dL (ref 31.5–35.7)
MCV: 96 fL (ref 79–97)
Monocytes Absolute: 0.4 10*3/uL (ref 0.1–0.9)
Monocytes: 6 %
Neutrophils Absolute: 3.7 10*3/uL (ref 1.4–7.0)
Neutrophils: 60 %
Platelets: 225 10*3/uL (ref 150–450)
RBC: 4.56 x10E6/uL (ref 4.14–5.80)
RDW: 13.2 % (ref 11.6–15.4)
WBC: 6.3 10*3/uL (ref 3.4–10.8)

## 2022-02-21 LAB — LIPID PANEL
Chol/HDL Ratio: 2.6 ratio (ref 0.0–5.0)
Cholesterol, Total: 188 mg/dL (ref 100–199)
HDL: 72 mg/dL (ref >39)
LDL Chol Calc (NIH): 99 mg/dL (ref 0–99)
Triglycerides: 94 mg/dL (ref 0–149)
VLDL Cholesterol Cal: 17 mg/dL (ref 5–40)

## 2022-02-21 LAB — TESTOSTERONE,FREE AND TOTAL
Testosterone, Free: 8.8 pg/mL (ref 7.2–24.0)
Testosterone: 284 ng/dL (ref 264–916)

## 2022-02-21 LAB — TSH: TSH: 2.74 u[IU]/mL (ref 0.450–4.500)

## 2022-03-08 LAB — COLOGUARD: COLOGUARD: NEGATIVE

## 2022-06-22 ENCOUNTER — Emergency Department (HOSPITAL_BASED_OUTPATIENT_CLINIC_OR_DEPARTMENT_OTHER)
Admission: EM | Admit: 2022-06-22 | Discharge: 2022-06-22 | Disposition: A | Discharge status: Home / Self Care | Payer: BC Managed Care – PPO | Attending: Emergency Medicine | Admitting: Emergency Medicine

## 2022-06-22 ENCOUNTER — Encounter (HOSPITAL_BASED_OUTPATIENT_CLINIC_OR_DEPARTMENT_OTHER): Payer: Self-pay | Admitting: Emergency Medicine

## 2022-06-22 ENCOUNTER — Emergency Department (HOSPITAL_BASED_OUTPATIENT_CLINIC_OR_DEPARTMENT_OTHER): Payer: BC Managed Care – PPO

## 2022-06-22 ENCOUNTER — Other Ambulatory Visit: Payer: Self-pay

## 2022-06-22 DIAGNOSIS — R000 Tachycardia, unspecified: Secondary | ICD-10-CM | POA: Diagnosis not present

## 2022-06-22 DIAGNOSIS — Z79899 Other long term (current) drug therapy: Secondary | ICD-10-CM | POA: Insufficient documentation

## 2022-06-22 DIAGNOSIS — R079 Chest pain, unspecified: Secondary | ICD-10-CM | POA: Diagnosis not present

## 2022-06-22 DIAGNOSIS — I10 Essential (primary) hypertension: Secondary | ICD-10-CM | POA: Diagnosis not present

## 2022-06-22 LAB — BASIC METABOLIC PANEL
Anion gap: 11 (ref 5–15)
BUN: 11 mg/dL (ref 6–20)
CO2: 25 mmol/L (ref 22–32)
Calcium: 9.3 mg/dL (ref 8.9–10.3)
Chloride: 103 mmol/L (ref 98–111)
Creatinine, Ser: 0.93 mg/dL (ref 0.61–1.24)
GFR, Estimated: >60 mL/min (ref >60)
Glucose, Bld: 108 mg/dL — ABNORMAL HIGH (ref 70–99)
Potassium: 3.7 mmol/L (ref 3.5–5.1)
Sodium: 139 mmol/L (ref 135–145)

## 2022-06-22 LAB — CBC
HCT: 40.6 % (ref 39.0–52.0)
Hemoglobin: 14.3 g/dL (ref 13.0–17.0)
MCH: 32.4 pg (ref 26.0–34.0)
MCHC: 35.2 g/dL (ref 30.0–36.0)
MCV: 92.1 fL (ref 80.0–100.0)
Platelets: 251 10*3/uL (ref 150–400)
RBC: 4.41 MIL/uL (ref 4.22–5.81)
RDW: 12.9 % (ref 11.5–15.5)
WBC: 9.4 10*3/uL (ref 4.0–10.5)
nRBC: 0 % (ref 0.0–0.2)

## 2022-06-22 LAB — TROPONIN I (HIGH SENSITIVITY)
Troponin I (High Sensitivity): 5 ng/L (ref <18)
Troponin I (High Sensitivity): 13 ng/L (ref <18)

## 2022-06-22 MED ORDER — SODIUM CHLORIDE 0.9 % IV BOLUS
1000.0000 mL | Freq: Once | INTRAVENOUS | Status: AC
  Administered 2022-06-22: 1000 mL via INTRAVENOUS

## 2022-06-22 MED ORDER — ASPIRIN 81 MG PO CHEW
324.0000 mg | CHEWABLE_TABLET | Freq: Once | ORAL | Status: AC
  Administered 2022-06-22: 324 mg via ORAL
  Filled 2022-06-22: qty 4

## 2022-06-22 MED ORDER — METOPROLOL TARTRATE 5 MG/5ML IV SOLN
2.5000 mg | Freq: Once | INTRAVENOUS | Status: AC
  Administered 2022-06-22: 2.5 mg via INTRAVENOUS
  Filled 2022-06-22: qty 5

## 2022-06-22 NOTE — Discharge Instructions (Signed)
You were seen in the emergency department for an episode of chest pain and rapid heart rate.  You had blood work EKG and chest x-ray that did not show any obvious signs of heart injury.  When you presented to the emergency department and your heart rate was in the 200s but this resolved by the time you are on the monitor so it was unclear what this rhythm was.  This will need follow-up with cardiology.  We have placed a referral and they should be calling you with a follow-up appointment.  If you experience another episode of this please return to the emergency department as soon as possible.  Continue your regular medications.  Keep well-hydrated.

## 2022-06-22 NOTE — ED Provider Notes (Signed)
[place] EMERGENCY DEPARTMENT AT [facility] Provider Note   CSN: [id number] Arrival date & time: 06/22/22  2015     History {Add pertinent medical, surgical, social history, OB history to HPI:1} Chief Complaint  Patient presents with   Chest Pain    Larry Patel is a 57 y.o. male.  He has a history of hypertension and reflux.  He said about an hour ago he began experiencing some indigestion feeling and then some tightness across to his sternum, felt his heart racing.  Felt short of breath.  On presentation to the ED he was tachycardic in the possible 200s.  By the time he got on the monitor he is in sinus rhythm at a rate of 110.  He said his symptoms are improving.  This is never happened before.  He did endorse 1 beer earlier no drugs no other stimulants.  No known cardiac history.  The history is provided by the patient.  Chest Pain Pain location:  Substernal area Pain quality: pressure and tightness   Pain severity:  Moderate Onset quality:  Sudden Duration:  1 hour Timing:  Constant Progression:  Resolved Chronicity:  New Relieved by:  None tried Worsened by:  Nothing Ineffective treatments:  None tried Associated symptoms: diaphoresis, heartburn and shortness of breath   Associated symptoms: no abdominal pain, no cough and no fever   Risk factors: hypertension        Home Medications Prior to Admission medications   Medication Sig Start Date End Date Taking? Authorizing Provider  losartan (COZAAR) 50 MG tablet Take 1 tablet (50 mg total) by mouth daily. 02/02/22   Gabriel Earing, FNP  Multiple Vitamins-Minerals (MULTIVITAMIN WITH MINERALS) tablet Take 1 tablet by mouth daily.    [provider]  omeprazole (PRILOSEC) 40 MG capsule Take 1 capsule (40 mg total) by mouth daily. 02/02/22   Gabriel Earing, FNP      Allergies    Patient has no known allergies.    Review of Systems   Review of Systems  Constitutional:  Positive for diaphoresis.  Negative for fever.  HENT:  Negative for sore throat.   Eyes:  Negative for visual disturbance.  Respiratory:  Positive for shortness of breath. Negative for cough.   Cardiovascular:  Positive for chest pain.  Gastrointestinal:  Positive for heartburn. Negative for abdominal pain.  Genitourinary:  Negative for dysuria.  Skin:  Negative for rash.    Physical Exam Updated Vital Signs There were no vitals taken for this visit. Physical Exam Vitals and nursing note reviewed.  Constitutional:      General: He is not in acute distress.    Appearance: He is well-developed.  HENT:     Head: Normocephalic and atraumatic.  Eyes:     Conjunctiva/sclera: Conjunctivae normal.  Cardiovascular:     Rate and Rhythm: Regular rhythm. Tachycardia present.     Heart sounds: Normal heart sounds. No murmur heard. Pulmonary:     Effort: Pulmonary effort is normal. No respiratory distress.     Breath sounds: Normal breath sounds.  Abdominal:     Palpations: Abdomen is soft.     Tenderness: There is no abdominal tenderness.  Musculoskeletal:        General: No swelling. Normal range of motion.     Cervical back: Neck supple.     Right lower leg: No tenderness. No edema.     Left lower leg: No tenderness. No edema.  Skin:    General: Skin  is warm and dry.     Capillary Refill: Capillary refill takes less than 2 seconds.  Neurological:     General: No focal deficit present.     Mental Status: He is alert.     ED Results / Procedures / Treatments   Labs (all labs ordered are listed, but only abnormal results are displayed) Labs Reviewed  BASIC METABOLIC PANEL  CBC  TROPONIN I (HIGH SENSITIVITY)    EKG None  Radiology No results found.  Procedures Procedures  {Document cardiac monitor, telemetry assessment procedure when appropriate:1}  Medications Ordered in ED Medications  sodium chloride 0.9 % bolus 1,000 mL (has no administration in time range)  aspirin chewable tablet 324  mg (has no administration in time range)    ED Course/ Medical Decision Making/ A&P   {   Click here for ABCD2, HEART and other calculatorsREFRESH Note before signing :1}                          Medical Decision Making Amount and/or Complexity of Data Reviewed Labs: ordered. Radiology: ordered.  Risk OTC drugs.   This patient complains of ***; this involves an extensive number of treatment Options and is a complaint that carries with it a high risk of complications and morbidity. The differential includes ***  I ordered, reviewed and interpreted labs, which included *** I ordered medication *** and reviewed PMP when indicated. I ordered imaging studies which included *** and I independently    visualized and interpreted imaging which showed *** Additional history obtained from *** Previous records obtained and reviewed *** I consulted *** and discussed lab and imaging findings and discussed disposition.  Cardiac monitoring reviewed, *** Social determinants considered, *** Critical Interventions: ***  After the interventions stated above, I reevaluated the patient and found *** Admission and further testing considered, ***   {Document critical care time when appropriate:1} {Document review of labs and clinical decision tools ie heart score, Chads2Vasc2 etc:1}  {Document your independent review of radiology images, and any outside records:1} {Document your discussion with family members, caretakers, and with consultants:1} {Document social determinants of health affecting pt's care:1} {Document your decision making why or why not admission, treatments were needed:1} Final Clinical Impression(s) / ED Diagnoses Final diagnoses:  None    Rx / DC Orders ED Discharge Orders     None

## 2022-06-22 NOTE — ED Triage Notes (Addendum)
Pt presents to ED POV. Pt c/o tightness to center of chest that began while golfing. Pt also reports SOB. No cardiac hx  Upon placing pt on monitor in triage pt noted to have HR in 200's. This RN palpated radial pulse noted to be weak and irregular. Pt's fingers became cyanotic shortly after. Pt transferred into room 14 and hooked up to monitor in room and hr noted to be 110. Dr. Charm Barges notified

## 2022-08-03 ENCOUNTER — Ambulatory Visit: Payer: BC Managed Care – PPO | Admitting: Family Medicine

## 2022-08-03 ENCOUNTER — Other Ambulatory Visit: Payer: Self-pay | Admitting: Family Medicine

## 2022-08-03 DIAGNOSIS — K219 Gastro-esophageal reflux disease without esophagitis: Secondary | ICD-10-CM

## 2022-08-03 DIAGNOSIS — I10 Essential (primary) hypertension: Secondary | ICD-10-CM

## 2022-08-15 ENCOUNTER — Encounter: Payer: Self-pay | Admitting: Family Medicine

## 2022-08-15 ENCOUNTER — Ambulatory Visit: Payer: BC Managed Care – PPO | Admitting: Family Medicine

## 2022-08-15 VITALS — BP 134/74 | HR 89 | Temp 98.3°F | Ht 69.0 in | Wt 217.5 lb

## 2022-08-15 DIAGNOSIS — K219 Gastro-esophageal reflux disease without esophagitis: Secondary | ICD-10-CM | POA: Diagnosis not present

## 2022-08-15 DIAGNOSIS — I10 Essential (primary) hypertension: Secondary | ICD-10-CM

## 2022-08-15 DIAGNOSIS — K429 Umbilical hernia without obstruction or gangrene: Secondary | ICD-10-CM

## 2022-08-15 DIAGNOSIS — E6609 Other obesity due to excess calories: Secondary | ICD-10-CM

## 2022-08-15 DIAGNOSIS — Z23 Encounter for immunization: Secondary | ICD-10-CM | POA: Diagnosis not present

## 2022-08-15 DIAGNOSIS — J302 Other seasonal allergic rhinitis: Secondary | ICD-10-CM

## 2022-08-15 DIAGNOSIS — Z6832 Body mass index (BMI) 32.0-32.9, adult: Secondary | ICD-10-CM

## 2022-08-15 MED ORDER — FLUTICASONE PROPIONATE 50 MCG/ACT NA SUSP
2.0000 | Freq: Every day | NASAL | 6 refills | Status: AC
Start: 2022-08-15 — End: ?

## 2022-08-15 NOTE — Progress Notes (Unsigned)
   Established Patient Office Visit  Subjective   Patient ID: Larry Patel, male    DOB: 11/20/1965  Age: 57 y.o. MRN: [id number]  Chief Complaint  Patient presents with  . Medical Management of Chronic Issues  . Gastroesophageal Reflux  . Hypertension    HPI  HTN Complaint with meds - Yes Current Medications - losartan Checking BP at home - 130s/70s Exercising Regularly - sometimes Pertinent ROS:  Headache - No Fatigue - No Visual Disturbances - No Chest pain - No Dyspnea - No Palpitations - No LE edema - No  Well balanced diet.   2. GERD Compliant with medications - {yes no free text:20080} Current medications - *** Adverse side effects - {yes no free text:20080} DEXA if on PPI - {dexa:315725}. Cough - {yes no free text:20080} Sore throat - {yes no free text:20080} Voice change - {yes no free text:20080} Hemoptysis - {yes no free text:20080} Dysphagia or dyspepsia - {yes no free text:20080} Water brash - {yes no free text:20080} Red Flags (weight loss, hematochezia, melena, weight loss, early satiety, fevers, odynophagia, or persistent vomiting) - {yes no free text:20080}  3. Umbilical hernia Tender to the touch.   {History (Optional):23778}  ROS    Objective:     BP 134/74   Pulse 89   Temp 98.3 F (36.8 C) (Temporal)   Ht 5\' 9"  (1.753 m)   Wt 217 lb 8 oz (98.7 kg)   SpO2 95%   BMI 32.12 kg/m  {Vitals History (Optional):23777}  Physical Exam   No results found for any visits on 08/15/22.  {Labs (Optional):23779}  The 10-year ASCVD risk score (Arnett DK, et al., 2019) is: 4.3%    Assessment & Plan:   Problem List Items Addressed This Visit       Cardiovascular and Mediastinum   Hypertension - Primary     Digestive   GERD (gastroesophageal reflux disease)    Return if symptoms worsen or fail to improve.    Gabriel Earing, FNP

## 2022-08-16 DIAGNOSIS — J302 Other seasonal allergic rhinitis: Secondary | ICD-10-CM | POA: Insufficient documentation

## 2022-08-20 NOTE — Progress Notes (Unsigned)
Cardiology Office Note:   Date:  08/21/2022  NAME:  Larry Patel    MRN: [id number] DOB:  1965/07/13   PCP:  Gabriel Earing, FNP  Cardiologist:  Reatha Harps, MD  Electrophysiologist:  None   Referring MD: Gabriel Earing, FNP   Chief Complaint  Patient presents with   Chest Pain    History of Present Illness:   Larry Patel is a 57 y.o. male with a hx of GERD, HTN who is being seen today for the evaluation of chest pain at the request of Gabriel Earing, FNP. ER 06/22/2022 for CP. Troponin negative.  He reports in June he was playing golf with his son.  He tells me it was in the middle of the summer and 98 degrees.  He tells me during the golf round he did not feel well.  Describes a sense of unease.  He reports he went back to the clubhouse early and did not complete the round.  He tells me he felt overheated.  While driving home he developed tightness in his chest and rapid heartbeat sensation.  He also felt dizzy.  He went to the emergency room.  EKG there showed sinus tachycardia.  Troponin is negative.  Chest x-ray normal.  He tells me he may have not had enough water to drink as well.  He was overheated per his report.  He has hypertension that is treated.  He also has acid reflux.  He reports no prior history of heart disease.  He tells me there is no family history of heart disease.  He does not smoke.  Reports alcohol moderation.  No drug use.  He has 2 children.  He is married.  He works as a Pharmacologist.  Tells me he is exposed to hot temperatures but reports that humidity was quite bad when he went to the ER.  He has had no further episodes.  Reports he goes to the gym without chest pains or trouble breathing.  No further episodes similar to what he experienced.  T chol 188, HDL 72, LDL 99, TG 94  Past Medical History: Past Medical History:  Diagnosis Date   GERD (gastroesophageal reflux disease)    Hypertension     Past Surgical History: Past Surgical History:   Procedure Laterality Date   SCAR REVISION Right     Current Medications: Current Meds  Medication Sig   fluticasone (FLONASE) 50 MCG/ACT nasal spray Place 2 sprays into both nostrils daily.   losartan (COZAAR) 50 MG tablet TAKE 1 TABLET BY MOUTH EVERY DAY   Multiple Vitamins-Minerals (MULTIVITAMIN WITH MINERALS) tablet Take 1 tablet by mouth daily.   omeprazole (PRILOSEC) 40 MG capsule TAKE 1 CAPSULE (40 MG TOTAL) BY MOUTH DAILY.     Allergies:    Patient has no known allergies.   Social History: Social History   Socioeconomic History   Marital status: Married    Spouse name: Not on file   Number of children: 2   Years of education: Not on file   Highest education level: Not on file  Occupational History   Occupation: [employer]  Tobacco Use   Smoking status: Never   Smokeless tobacco: Never  Vaping Use   Vaping status: Never Used  Substance and Sexual Activity   Alcohol use: Not Currently    Comment: occa   Drug use: Never   Sexual activity: Not on file  Other Topics Concern   Not on file  Social History  Narrative   ** Merged History Encounter **       Social Determinants of Health   Financial Resource Strain: Not on file  Food Insecurity: Not on file  Transportation Needs: Not on file  Physical Activity: Not on file  Stress: Not on file  Social Connections: Not on file     Family History: The patient's family history includes Diabetes in his maternal aunt; Healthy in his mother and sister; Hypertension in his father.  ROS:   All other ROS reviewed and negative. Pertinent positives noted in the HPI.     EKGs/Labs/Other Studies Reviewed:   The following studies were personally reviewed by me today:  EKG:  EKG is not ordered today.  EKG dated 06/22/2022 shows sinus tachycardia heart rate 111, no acute ischemic changes or evidence of infarction.      Recent Labs: 02/14/2022: ALT 19; TSH 2.740 06/22/2022: BUN 11; Creatinine, Ser 0.93; Hemoglobin  14.3; Platelets 251; Potassium 3.7; Sodium 139   Recent Lipid Panel    Component Value Date/Time   CHOL 188 02/14/2022 0809   TRIG 94 02/14/2022 0809   HDL 72 02/14/2022 0809   CHOLHDL 2.6 02/14/2022 0809   LDLCALC 99 02/14/2022 0809    Physical Exam:   VS:  BP 132/82 (BP Location: Left Arm, Patient Position: Sitting, Cuff Size: Normal)   Pulse 93   Ht 5\' 9"  (1.753 m)   Wt 220 lb 9.6 oz (100.1 kg)   SpO2 97%   BMI 32.58 kg/m    Wt Readings from Last 3 Encounters:  08/21/22 220 lb 9.6 oz (100.1 kg)  08/15/22 217 lb 8 oz (98.7 kg)  02/02/22 219 lb 2 oz (99.4 kg)    General: Well nourished, well developed, in no acute distress Head: Atraumatic, normal size  Eyes: PEERLA, EOMI  Neck: Supple, no JVD Endocrine: No thryomegaly Cardiac: Normal S1, S2; RRR; no murmurs, rubs, or gallops Lungs: Clear to auscultation bilaterally, no wheezing, rhonchi or rales  Abd: Soft, nontender, no hepatomegaly  Ext: No edema, pulses 2+ Musculoskeletal: No deformities, BUE and BLE strength normal and equal Skin: Warm and dry, no rashes   Neuro: Alert and oriented to person, place, time, and situation, CNII-XII grossly intact, no focal deficits  Psych: Normal mood and affect   ASSESSMENT:   Larry Patel is a 57 y.o. male who presents for the following: 1. Tachycardia   2. Dizziness   3. Precordial pain     PLAN:   1. Tachycardia 2. Dizziness 3. Precordial pain -Describes a one-time episode of heart racing, dizziness and chest tightness when playing golf and 98 degree weather.  Workup in the ER showed sinus tachycardia with no acute ischemic changes.  Chest x-ray was negative.  Troponins negative for MI.  He reports he may have not had enough water to drink and it was very hot that day.  I suspect he was dehydrated and likely experienced heat exhaustion.  He has had no further episodes.  He is exercising without chest pains or trouble breathing.  No more rapid heartbeat sensation.  We did  discuss testing versus watchful waiting.  At this time he would like to forego testing which is quite reasonable.  He really has no major risk factors for heart disease other than hypertension.  There is no strong family history of heart disease.  His cholesterol levels are controlled.  He can see [place] back as needed if symptoms recur.      Disposition: Return if  symptoms worsen or fail to improve.  Medication Adjustments/Labs and Tests Ordered: Current medicines are reviewed at length with the patient today.  Concerns regarding medicines are outlined above.  No orders of the defined types were placed in this encounter.  No orders of the defined types were placed in this encounter.  Patient Instructions  Medication Instructions:  The current medical regimen is effective;  continue present plan and medications as directed. Please refer to the Current Medication list given to you today.  *If you need a refill on your cardiac medications before your next appointment, please call your pharmacy*  Lab Work: NONE If you have labs (blood work) drawn today and your tests are completely normal, you will receive your results only by: MyChart Message (if you have MyChart) OR A paper copy in the mail If you have any lab test that is abnormal or we need to change your treatment, we will call you to review the results.  Testing/Procedures: NONE  Follow-Up: At [facility], you and your health needs are our priority.  As part of our continuing mission to provide you with exceptional heart care, we have created designated Provider Care Teams.  These Care Teams include your primary Cardiologist (physician) and Advanced Practice Providers (APPs -  Physician Assistants and Nurse Practitioners) who all work together to provide you with the care you need, when you need it.  Your next appointment:   AS NEEDED   Provider:   Reatha Harps, MD     Other Instructions    Signed, Lenna Gilford.  Flora Lipps, MD, [facility]  [facility]  [address] [place], [place] [address] [phone]  08/21/2022 8:33 PM

## 2022-08-21 ENCOUNTER — Ambulatory Visit: Payer: BC Managed Care – PPO | Attending: Cardiovascular Disease | Admitting: Cardiovascular Disease

## 2022-08-21 ENCOUNTER — Encounter: Payer: Self-pay | Admitting: Cardiovascular Disease

## 2022-08-21 VITALS — BP 132/82 | HR 93 | Ht 69.0 in | Wt 220.6 lb

## 2022-08-21 DIAGNOSIS — R072 Precordial pain: Secondary | ICD-10-CM | POA: Diagnosis not present

## 2022-08-21 DIAGNOSIS — R42 Dizziness and giddiness: Secondary | ICD-10-CM | POA: Diagnosis not present

## 2022-08-21 DIAGNOSIS — R000 Tachycardia, unspecified: Secondary | ICD-10-CM

## 2022-08-21 NOTE — Patient Instructions (Addendum)
Medication Instructions:  The current medical regimen is effective;  continue present plan and medications as directed. Please refer to the Current Medication list given to you today.  *If you need a refill on your cardiac medications before your next appointment, please call your pharmacy*  Lab Work: NONE If you have labs (blood work) drawn today and your tests are completely normal, you will receive your results only by: MyChart Message (if you have MyChart) OR A paper copy in the mail If you have any lab test that is abnormal or we need to change your treatment, we will call you to review the results.  Testing/Procedures: NONE  Follow-Up: At [facility], you and your health needs are our priority.  As part of our continuing mission to provide you with exceptional heart care, we have created designated Provider Care Teams.  These Care Teams include your primary Cardiologist (physician) and Advanced Practice Providers (APPs -  Physician Assistants and Nurse Practitioners) who all work together to provide you with the care you need, when you need it.  Your next appointment:   AS NEEDED   Provider:   Reatha Harps, MD     Other Instructions

## 2022-10-02 ENCOUNTER — Encounter: Payer: Self-pay | Admitting: *Deleted

## 2022-10-02 ENCOUNTER — Encounter: Payer: Self-pay | Admitting: Surgery

## 2022-10-02 ENCOUNTER — Ambulatory Visit (INDEPENDENT_AMBULATORY_CARE_PROVIDER_SITE_OTHER): Payer: BC Managed Care – PPO | Admitting: Surgery

## 2022-10-02 ENCOUNTER — Other Ambulatory Visit: Payer: Self-pay

## 2022-10-02 VITALS — BP 161/99 | HR 70 | Temp 98.3°F | Resp 18 | Ht 69.0 in | Wt 219.0 lb

## 2022-10-02 DIAGNOSIS — K429 Umbilical hernia without obstruction or gangrene: Secondary | ICD-10-CM | POA: Diagnosis not present

## 2022-10-02 DIAGNOSIS — M6208 Separation of muscle (nontraumatic), other site: Secondary | ICD-10-CM | POA: Diagnosis not present

## 2022-10-02 NOTE — Progress Notes (Signed)
[facility] History and Physical  Reason for Referral: Umbilical hernia Referring Physician: Harlow Mares, NP  Chief Complaint   New Patient (Initial Visit)     Larry Patel is a 57 y.o. male.  HPI: Patient presents for evaluation of an umbilical hernia.  He is noting a bulge at and just above his umbilicus.  The bulge has been present for about a year, and he believes that it is increasing in size.  He denies any pain at the area unless you push on it.  He is tolerating a diet without nausea and vomiting, and moving his bowels without issue.  His past medical history significant for hypertension and GERD.  He denies use of blood thinning medications.  He denies ever having any surgeries on his abdomen.  He quit smoking 30 years ago and will occasionally drink alcohol.  He denies use of illicit drugs.  Past Medical History:  Diagnosis Date   GERD (gastroesophageal reflux disease)    Hypertension     Past Surgical History:  Procedure Laterality Date   SCAR REVISION Right     Family History  Problem Relation Age of Onset   Healthy Mother    Hypertension Father    Healthy Sister    Diabetes Maternal Aunt     Social History   Tobacco Use   Smoking status: Former    Current packs/day: 0.00    Types: Cigarettes    Quit date: 1999    Years since quitting: 25.7    Passive exposure: Never   Smokeless tobacco: Never  Vaping Use   Vaping status: Never Used  Substance Use Topics   Alcohol use: Not Currently    Comment: occa   Drug use: Never    Medications: I have reviewed the patient's current medications. Allergies as of 10/02/2022   No Known Allergies      Medication List        Accurate as of October 02, 2022  9:43 AM. If you have any questions, ask your nurse or doctor.          fluticasone 50 MCG/ACT nasal spray Commonly known as: FLONASE Place 2 sprays into both nostrils daily.   losartan 50 MG tablet Commonly known as: COZAAR TAKE  1 TABLET BY MOUTH EVERY DAY   multivitamin with minerals tablet Take 1 tablet by mouth daily.   omeprazole 40 MG capsule Commonly known as: PRILOSEC TAKE 1 CAPSULE (40 MG TOTAL) BY MOUTH DAILY.         ROS:  Constitutional: negative for chills, fatigue, and fevers Eyes: negative for visual disturbance and pain Ears, nose, mouth, throat, and face: negative for ear drainage, sore throat, and sinus problems Respiratory: negative for cough, wheezing, and shortness of breath Cardiovascular: negative for chest pain and palpitations Gastrointestinal: negative for abdominal pain, nausea, reflux symptoms, and vomiting Genitourinary:negative for dysuria and frequency Integument/breast: negative for dryness and rash Hematologic/lymphatic: negative for bleeding and lymphadenopathy Musculoskeletal:negative for back pain and neck pain Neurological: negative for dizziness and tremors Endocrine: negative for temperature intolerance  Blood pressure (!) 161/99, pulse 70, temperature 98.3 F (36.8 C), temperature source Oral, resp. rate 18, height 5\' 9"  (1.753 m), weight 219 lb (99.3 kg), SpO2 97%. Physical Exam Vitals reviewed.  Constitutional:      Appearance: Normal appearance.  HENT:     Head: Normocephalic and atraumatic.  Eyes:     Extraocular Movements: Extraocular movements intact.     Pupils: Pupils are equal, round, and  reactive to light.  Cardiovascular:     Rate and Rhythm: Normal rate and regular rhythm.  Pulmonary:     Effort: Pulmonary effort is normal.     Breath sounds: Normal breath sounds.  Abdominal:     Comments: Abdomen soft, nondistended, no percussion tenderness, nontender to palpation; no rigidity, guarding, rebound tenderness; soft and reducible 1 cm umbilical hernia, small supraumbilical diastasis recti noted  Musculoskeletal:        General: Normal range of motion.     Cervical back: Normal range of motion.  Skin:    General: Skin is warm and dry.   Neurological:     General: No focal deficit present.     Mental Status: He is alert and oriented to person, place, and time.  Psychiatric:        Mood and Affect: Mood normal.        Behavior: Behavior normal.     Results: No results found for this or any previous visit (from the past 48 hour(s)).  No results found.   Assessment & Plan:  Larry Patel is a 57 y.o. male who presents for evaluation of an umbilical hernia.  -I explained the pathophysiology of hernias, and why we recommend surgical repair.  I also explained that the bulge above his umbilicus is diastasis recti, and we discussed the pathophysiology of this and how to improve the separation. -We discussed surgical options for the hernia, and I explained that the diastasis recti is generally treated by abdominal exercises -At this time, the patient expressed that he would like to monitor the area rather than immediately proceed to surgical interventions.  I explained that this is a reasonable approach -Information provided to the patient regarding umbilical hernias and diastases recti -Advised that if he begins to have a painful nonreducible bulge in his umbilicus, nausea, vomiting, and obstipation, he needs to present to the emergency department for evaluation -Follow up with me as needed  All questions were answered to the satisfaction of the patient.  Theophilus Kinds, DO [facility] [address] [place], [place] [address] [phone] (office)

## 2022-11-04 ENCOUNTER — Other Ambulatory Visit: Payer: Self-pay | Admitting: Family Medicine

## 2022-11-04 DIAGNOSIS — I10 Essential (primary) hypertension: Secondary | ICD-10-CM

## 2022-11-04 DIAGNOSIS — K219 Gastro-esophageal reflux disease without esophagitis: Secondary | ICD-10-CM

## 2022-11-13 ENCOUNTER — Encounter: Payer: Self-pay | Admitting: *Deleted

## 2022-11-22 ENCOUNTER — Telehealth: Payer: Self-pay | Admitting: Family Medicine

## 2022-11-22 NOTE — Telephone Encounter (Signed)
Copied from CRM [id number]. Topic: General - Other >> Nov 22, 2022  9:39 AM Dennison Nancy wrote: Reason for CRM: patient need a letter completed for [employer] , need completed today , patient has a copy from the last time the form was completed   call back #  [phone] Per spouse Davonne Dehner stated she will be coming in the office to explain the letter , refuse to make an appointment with agent at this time

## 2022-11-23 ENCOUNTER — Ambulatory Visit: Payer: BC Managed Care – PPO | Admitting: Family Medicine

## 2022-11-23 ENCOUNTER — Encounter: Payer: Self-pay | Admitting: Family Medicine

## 2022-11-23 VITALS — BP 132/82 | HR 91 | Temp 97.6°F | Ht 69.0 in | Wt 217.6 lb

## 2022-11-23 DIAGNOSIS — Z0289 Encounter for other administrative examinations: Secondary | ICD-10-CM | POA: Diagnosis not present

## 2022-11-23 DIAGNOSIS — K219 Gastro-esophageal reflux disease without esophagitis: Secondary | ICD-10-CM

## 2022-11-23 DIAGNOSIS — I10 Essential (primary) hypertension: Secondary | ICD-10-CM | POA: Diagnosis not present
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
## 2022-12-23 ENCOUNTER — Encounter (HOSPITAL_BASED_OUTPATIENT_CLINIC_OR_DEPARTMENT_OTHER): Payer: Self-pay

## 2022-12-23 ENCOUNTER — Emergency Department (HOSPITAL_BASED_OUTPATIENT_CLINIC_OR_DEPARTMENT_OTHER)
Admission: EM | Admit: 2022-12-23 | Discharge: 2022-12-23 | Disposition: A | Discharge status: Home / Self Care | Payer: BC Managed Care – PPO | Attending: Emergency Medicine | Admitting: Emergency Medicine

## 2022-12-23 ENCOUNTER — Other Ambulatory Visit: Payer: Self-pay

## 2022-12-23 DIAGNOSIS — Z79899 Other long term (current) drug therapy: Secondary | ICD-10-CM | POA: Insufficient documentation

## 2022-12-23 DIAGNOSIS — I10 Essential (primary) hypertension: Secondary | ICD-10-CM | POA: Insufficient documentation

## 2022-12-23 DIAGNOSIS — T7840XA Allergy, unspecified, initial encounter: Secondary | ICD-10-CM | POA: Insufficient documentation

## 2022-12-23 DIAGNOSIS — R21 Rash and other nonspecific skin eruption: Secondary | ICD-10-CM | POA: Diagnosis present

## 2022-12-23 LAB — CBC WITH DIFFERENTIAL/PLATELET
Abs Immature Granulocytes: 0.05 10*3/uL (ref 0.00–0.07)
Basophils Absolute: 0 10*3/uL (ref 0.0–0.1)
Basophils Relative: 0 %
Eosinophils Absolute: 0.3 10*3/uL (ref 0.0–0.5)
Eosinophils Relative: 2 %
HCT: 43.3 % (ref 39.0–52.0)
Hemoglobin: 14.9 g/dL (ref 13.0–17.0)
Immature Granulocytes: 0 %
Lymphocytes Relative: 12 %
Lymphs Abs: 1.4 10*3/uL (ref 0.7–4.0)
MCH: 31.7 pg (ref 26.0–34.0)
MCHC: 34.4 g/dL (ref 30.0–36.0)
MCV: 92.1 fL (ref 80.0–100.0)
Monocytes Absolute: 0.5 10*3/uL (ref 0.1–1.0)
Monocytes Relative: 4 %
Neutro Abs: 9.9 10*3/uL — ABNORMAL HIGH (ref 1.7–7.7)
Neutrophils Relative %: 82 %
Platelets: 214 10*3/uL (ref 150–400)
RBC: 4.7 MIL/uL (ref 4.22–5.81)
RDW: 13.2 % (ref 11.5–15.5)
WBC: 12.1 10*3/uL — ABNORMAL HIGH (ref 4.0–10.5)
nRBC: 0 % (ref 0.0–0.2)

## 2022-12-23 LAB — COMPREHENSIVE METABOLIC PANEL
ALT: 18 U/L (ref 0–44)
AST: 15 U/L (ref 15–41)
Albumin: 3.8 g/dL (ref 3.5–5.0)
Alkaline Phosphatase: 56 U/L (ref 38–126)
Anion gap: 6 (ref 5–15)
BUN: 17 mg/dL (ref 6–20)
CO2: 28 mmol/L (ref 22–32)
Calcium: 8.9 mg/dL (ref 8.9–10.3)
Chloride: 102 mmol/L (ref 98–111)
Creatinine, Ser: 0.79 mg/dL (ref 0.61–1.24)
GFR, Estimated: >60 mL/min (ref >60)
Glucose, Bld: 123 mg/dL — ABNORMAL HIGH (ref 70–99)
Potassium: 4.5 mmol/L (ref 3.5–5.1)
Sodium: 136 mmol/L (ref 135–145)
Total Bilirubin: 0.5 mg/dL (ref <1.2)
Total Protein: 6.9 g/dL (ref 6.5–8.1)

## 2022-12-23 MED ORDER — PREDNISONE 20 MG PO TABS: 20.0000 mg | ORAL_TABLET | Freq: Every day | ORAL | 0 refills | Status: AC

## 2022-12-23 MED ORDER — FAMOTIDINE 20 MG PO TABS
20.0000 mg | ORAL_TABLET | Freq: Once | ORAL | Status: AC
  Administered 2022-12-23: 20 mg via ORAL
  Filled 2022-12-23: qty 1

## 2022-12-23 MED ORDER — EPINEPHRINE 0.3 MG/0.3ML IJ SOAJ: 0.3000 mg | INTRAMUSCULAR | 0 refills | Status: AC | PRN

## 2022-12-23 MED ORDER — METHYLPREDNISOLONE SODIUM SUCC 125 MG IJ SOLR
125.0000 mg | Freq: Once | INTRAMUSCULAR | Status: AC
  Administered 2022-12-23: 125 mg via INTRAMUSCULAR
  Filled 2022-12-23: qty 2

## 2022-12-23 NOTE — Discharge Instructions (Addendum)
Overall suspect he had an allergic reaction.  I prescribed you an EpiPen to use if you develop any severe respiratory symptoms from an allergic reaction.  I printed you out some information about anaphylactic reaction.  Please continue Benadryl at home.  Follow-up with your primary care doctor.  I have given you the number to follow-up with an allergist as well.  Take next dose of steroid tomorrow called prednisone.  Return if symptoms worsen.

## 2022-12-23 NOTE — ED Triage Notes (Signed)
He c/o an allergic reaction since last evening. He has a rash on upper trunk and some swelling in his hands/feet and face. Dr. Lockie Mola examines him in triage. He is breathing normally.

## 2022-12-23 NOTE — ED Provider Notes (Signed)
[place] EMERGENCY DEPARTMENT AT [facility] Provider Note   CSN: [id number] Arrival date & time: 12/23/22  7829     History  Chief Complaint  Patient presents with   Allergic Reaction    Larry Patel is a 57 y.o. male.  Patient here with allergic reaction.  Swelling to his eyes, hands, rash on his chest and back and legs that started around 1 AM.  He took Benadryl prior to arrival rash has not been improving.  He has no swelling of his lips or tongue.  No chest pain shortness of breath wheezing nausea vomiting diarrhea.  No new medications.  Except for maybe some reflux medicine he took last night.  He does take losartan.  States he had a similar episode about a month ago but much less mild that got better with Benadryl.  No other exposure otherwise.  The history is provided by the patient.       Home Medications Prior to Admission medications   Medication Sig Start Date End Date Taking? Authorizing Provider  EPINEPHrine 0.3 mg/0.3 mL IJ SOAJ injection Inject 0.3 mg into the muscle as needed for up to 1 dose for anaphylaxis. 12/23/22  Yes Shelva Hetzer, DO  predniSONE (DELTASONE) 20 MG tablet Take 1 tablet (20 mg total) by mouth daily for 3 days. 12/23/22 12/26/22 Yes Ching Rabideau, DO  fluticasone (FLONASE) 50 MCG/ACT nasal spray Place 2 sprays into both nostrils daily. 08/15/22   Gabriel Earing, FNP  losartan (COZAAR) 50 MG tablet TAKE 1 TABLET BY MOUTH EVERY Patel 11/05/22   Gabriel Earing, FNP  Multiple Vitamins-Minerals (MULTIVITAMIN WITH MINERALS) tablet Take 1 tablet by mouth daily.    [provider]  omeprazole (PRILOSEC) 40 MG capsule TAKE 1 CAPSULE (40 MG TOTAL) BY MOUTH DAILY. 11/05/22   Gabriel Earing, FNP      Allergies    Patient has no known allergies.    Review of Systems   Review of Systems  Physical Exam Updated Vital Signs BP (!) 159/107   Pulse 86   SpO2 100%  Physical Exam Vitals and nursing note reviewed.   Constitutional:      General: He is not in acute distress.    Appearance: He is well-developed.  HENT:     Head: Normocephalic and atraumatic.  Eyes:     Conjunctiva/sclera: Conjunctivae normal.     Comments: Swelling around the eyes, some swelling to the hands  Cardiovascular:     Rate and Rhythm: Normal rate and regular rhythm.     Pulses: Normal pulses.     Heart sounds: Normal heart sounds. No murmur heard. Pulmonary:     Effort: Pulmonary effort is normal. No respiratory distress.     Breath sounds: Normal breath sounds.  Abdominal:     Palpations: Abdomen is soft.     Tenderness: There is no abdominal tenderness.  Musculoskeletal:        General: No swelling.     Cervical back: Neck supple.  Skin:    General: Skin is warm and dry.     Capillary Refill: Capillary refill takes less than 2 seconds.     Findings: Rash present.     Comments: Hives to his chest and back  Neurological:     Mental Status: He is alert.  Psychiatric:        Mood and Affect: Mood normal.     ED Results / Procedures / Treatments   Labs (all labs ordered are listed,  but only abnormal results are displayed) Labs Reviewed  CBC WITH DIFFERENTIAL/PLATELET - Abnormal; Notable for the following components:      Result Value   WBC 12.1 (*)    Neutro Abs 9.9 (*)    All other components within normal limits  COMPREHENSIVE METABOLIC PANEL - Abnormal; Notable for the following components:   Glucose, Bld 123 (*)    All other components within normal limits    EKG None  Radiology No results found.  Procedures Procedures    Medications Ordered in ED Medications  methylPREDNISolone sodium succinate (SOLU-MEDROL) 125 mg/2 mL injection 125 mg (125 mg Intramuscular Given 12/23/22 0821)  famotidine (PEPCID) tablet 20 mg (20 mg Oral Given 12/23/22 1478)    ED Course/ Medical Decision Making/ A&P                                 Medical Decision Making Amount and/or Complexity of Data  Reviewed Labs: ordered.  Risk Prescription drug management.   Larry Patel is here with allergic reaction.  Patient with rash to his chest and back and some swelling irritation to his hands and feet, swelling around the eyes.  Similar episode about a month ago got better with Benadryl.  He is on losartan but does not look like angioedema.  He has no swelling to his tongue or lips or posterior oropharynx.  He is not having any wheezing or stridor.  No recent illness.  He did take some sort of Tums.  He woke up about 7 hours ago with this.  He took Benadryl rash is improving.  He has a history of hypertension and reflux.  Per my chart review he had lab works a few months ago that showed unremarkable labs.  Normal kidney to be an allergic reaction.  I have no concern for other acute process at this time does not look.  Do not think he meets criteria for epinephrine.  Lab work per my review and interpretation is unremarkable.  No on reevaluation swelling is greatly improved.  There is almost no swelling around his eyes anymore.  Overall back that this was an allergic reaction.  Recommend continued use of prednisone.  Prescribed EpiPen.  Will have him follow-up with primary care and also gave the number to follow-up with allergist.  Discharged in good condition.  Understands return precautions.  This chart was dictated using voice recognition software.  Despite best efforts to proofread,  errors can occur which can change the documentation meaning.         Final Clinical Impression(s) / ED Diagnoses Final diagnoses:  Allergic reaction, initial encounter    Rx / DC Orders ED Discharge Orders          Ordered    EPINEPHrine 0.3 mg/0.3 mL IJ SOAJ injection  As needed        12/23/22 1012    predniSONE (DELTASONE) 20 MG tablet  Daily        12/23/22 1012              [place], DO 12/23/22 1014

## 2023-02-18 ENCOUNTER — Ambulatory Visit: Payer: BC Managed Care – PPO | Admitting: Family Medicine

## 2023-02-21 ENCOUNTER — Ambulatory Visit: Payer: BC Managed Care – PPO | Admitting: Family Medicine

## 2023-02-28 ENCOUNTER — Encounter: Payer: Self-pay | Admitting: Family Medicine

## 2023-02-28 ENCOUNTER — Telehealth: Payer: Self-pay | Admitting: Family Medicine

## 2023-02-28 ENCOUNTER — Ambulatory Visit: Payer: BC Managed Care – PPO | Admitting: Family Medicine

## 2023-02-28 VITALS — BP 158/94 | HR 72 | Temp 97.6°F | Ht 69.0 in | Wt 217.0 lb

## 2023-02-28 DIAGNOSIS — Z0001 Encounter for general adult medical examination with abnormal findings: Secondary | ICD-10-CM

## 2023-02-28 DIAGNOSIS — I10 Essential (primary) hypertension: Secondary | ICD-10-CM | POA: Diagnosis not present

## 2023-02-28 DIAGNOSIS — Z0000 Encounter for general adult medical examination without abnormal findings: Secondary | ICD-10-CM

## 2023-02-28 DIAGNOSIS — Z6832 Body mass index (BMI) 32.0-32.9, adult: Secondary | ICD-10-CM

## 2023-02-28 DIAGNOSIS — K219 Gastro-esophageal reflux disease without esophagitis: Secondary | ICD-10-CM

## 2023-02-28 DIAGNOSIS — E66811 Obesity, class 1: Secondary | ICD-10-CM | POA: Diagnosis not present

## 2023-02-28 LAB — CBC WITH DIFFERENTIAL/PLATELET
Basophils Absolute: 0 10*3/uL (ref 0.0–0.2)
Basos: 1 %
EOS (ABSOLUTE): 0.2 10*3/uL (ref 0.0–0.4)
Eos: 3 %
Hematocrit: 40.5 % (ref 37.5–51.0)
Hemoglobin: 13.6 g/dL (ref 13.0–17.7)
Immature Grans (Abs): 0 10*3/uL (ref 0.0–0.1)
Immature Granulocytes: 0 %
Lymphocytes Absolute: 1.7 10*3/uL (ref 0.7–3.1)
Lymphs: 30 %
MCH: 32.2 pg (ref 26.6–33.0)
MCHC: 33.6 g/dL (ref 31.5–35.7)
MCV: 96 fL (ref 79–97)
Monocytes Absolute: 0.4 10*3/uL (ref 0.1–0.9)
Monocytes: 7 %
Neutrophils Absolute: 3.3 10*3/uL (ref 1.4–7.0)
Neutrophils: 59 %
Platelets: 241 10*3/uL (ref 150–450)
RBC: 4.23 x10E6/uL (ref 4.14–5.80)
RDW: 13 % (ref 11.6–15.4)
WBC: 5.6 10*3/uL (ref 3.4–10.8)

## 2023-02-28 LAB — TSH: TSH: 2.25 u[IU]/mL (ref 0.450–4.500)

## 2023-02-28 LAB — CMP14+EGFR
ALT: 23 [IU]/L (ref 0–44)
AST: 34 [IU]/L (ref 0–40)
Albumin: 4 g/dL (ref 3.8–4.9)
Alkaline Phosphatase: 70 [IU]/L (ref 44–121)
BUN/Creatinine Ratio: 20 (ref 9–20)
BUN: 17 mg/dL (ref 6–24)
Bilirubin Total: 0.3 mg/dL (ref 0.0–1.2)
CO2: 25 mmol/L (ref 20–29)
Calcium: 8.9 mg/dL (ref 8.7–10.2)
Chloride: 101 mmol/L (ref 96–106)
Creatinine, Ser: 0.86 mg/dL (ref 0.76–1.27)
Globulin, Total: 2.7 g/dL (ref 1.5–4.5)
Glucose: 110 mg/dL — ABNORMAL HIGH (ref 70–99)
Potassium: 4.5 mmol/L (ref 3.5–5.2)
Sodium: 140 mmol/L (ref 134–144)
Total Protein: 6.7 g/dL (ref 6.0–8.5)
eGFR: 102 mL/min/{1.73_m2} (ref >59)

## 2023-02-28 LAB — LIPID PANEL
Chol/HDL Ratio: 2.4 {ratio} (ref 0.0–5.0)
Cholesterol, Total: 189 mg/dL (ref 100–199)
HDL: 79 mg/dL (ref >39)
LDL Chol Calc (NIH): 100 mg/dL — ABNORMAL HIGH (ref 0–99)
Triglycerides: 51 mg/dL (ref 0–149)
VLDL Cholesterol Cal: 10 mg/dL (ref 5–40)

## 2023-02-28 MED ORDER — OMEPRAZOLE 40 MG PO CPDR: 40.0000 mg | DELAYED_RELEASE_CAPSULE | Freq: Every day | ORAL | 1 refills | Status: DC

## 2023-02-28 MED ORDER — LOSARTAN POTASSIUM 50 MG PO TABS: 50.0000 mg | ORAL_TABLET | Freq: Every day | ORAL | 1 refills | Status: DC

## 2023-02-28 NOTE — Progress Notes (Signed)
 Complete physical exam  Patient: Larry Patel   DOB: 10-Nov-1965   58 y.o. Male  MRN: [id number]  Subjective:    Chief Complaint  Patient presents with   Medical Management of Chronic Issues    Larry Patel is a 58 y.o. male who presents today for a complete physical exam. He reports consuming a general diet. Gym/ health club routine includes mod to heavy weightlifting. He also walks, jogs regularly. He generally feels well. He reports sleeping well. He does not have additional problems to discuss today.   BP at home is 130s/80s typically. Hasn't checked it recently. He did have a large cup of coffee just prior to his visit. Denies symptoms.   Most recent fall risk assessment:    02/28/2023    8:38 AM  Fall Risk   Falls in the past year? 0     Most recent depression screenings:    02/28/2023    8:38 AM 11/23/2022    1:33 PM  PHQ 2/9 Scores  PHQ - 2 Score 0 0  PHQ- 9 Score 0 0    Vision:Within last year and Dental: No current dental problems and Receives regular dental care  Past Medical History:  Diagnosis Date   GERD (gastroesophageal reflux disease)    Hypertension       Patient Care Team: Gabriel Earing, FNP as PCP - General (Family Medicine) O'Neal, Ronnald Ramp, MD as PCP - Cardiology (Cardiology)   Outpatient Medications Prior to Visit  Medication Sig   EPINEPHrine 0.3 mg/0.3 mL IJ SOAJ injection Inject 0.3 mg into the muscle as needed for up to 1 dose for anaphylaxis.   fluticasone (FLONASE) 50 MCG/ACT nasal spray Place 2 sprays into both nostrils daily.   losartan (COZAAR) 50 MG tablet TAKE 1 TABLET BY MOUTH EVERY DAY   Multiple Vitamins-Minerals (MULTIVITAMIN WITH MINERALS) tablet Take 1 tablet by mouth daily.   omeprazole (PRILOSEC) 40 MG capsule TAKE 1 CAPSULE (40 MG TOTAL) BY MOUTH DAILY.   No facility-administered medications prior to visit.    ROS Negative unless specially indicated above in HPI.     Objective:     BP (!) 158/94    Pulse 72   Temp 97.6 F (36.4 C) (Temporal)   Ht 5\' 9"  (1.753 m)   Wt 217 lb (98.4 kg)   SpO2 98%   BMI 32.05 kg/m    Physical Exam Vitals and nursing note reviewed.  Constitutional:      General: He is not in acute distress.    Appearance: He is not ill-appearing, toxic-appearing or diaphoretic.  HENT:     Head: Normocephalic.     Right Ear: Tympanic membrane, ear canal and external ear normal.     Left Ear: Tympanic membrane, ear canal and external ear normal.     Nose: Nose normal.     Mouth/Throat:     Mouth: Mucous membranes are moist.     Pharynx: Oropharynx is clear.  Eyes:     Extraocular Movements: Extraocular movements intact.     Conjunctiva/sclera: Conjunctivae normal.     Pupils: Pupils are equal, round, and reactive to light.  Cardiovascular:     Rate and Rhythm: Normal rate and regular rhythm.     Pulses: Normal pulses.     Heart sounds: Normal heart sounds. No murmur heard.    No friction rub. No gallop.  Pulmonary:     Effort: Pulmonary effort is normal.     Breath sounds: Normal breath  sounds.  Abdominal:     General: Bowel sounds are normal. There is no distension.     Palpations: Abdomen is soft. There is no mass.     Tenderness: There is no abdominal tenderness. There is no guarding.  Musculoskeletal:     Cervical back: Normal range of motion and neck supple. No tenderness.     Right lower leg: No edema.     Left lower leg: No edema.  Skin:    General: Skin is warm and dry.     Capillary Refill: Capillary refill takes less than 2 seconds.     Findings: No lesion or rash.  Neurological:     General: No focal deficit present.     Mental Status: He is alert and oriented to person, place, and time.  Psychiatric:        Mood and Affect: Mood normal.        Behavior: Behavior normal.        Thought Content: Thought content normal.      No results found for any visits on 02/28/23.     Assessment & Plan:    Routine Health Maintenance and  Physical Exam  Larry Patel was seen today for annual exam.  Diagnoses and all orders for this visit:  Routine general medical examination at a health care facility  Primary hypertension BP not at goal. He did have a large cup of coffee prior to visit. Will have him check BP at home and return in 2 weeks for BP check with nurse.  -     CBC with Differential/Platelet -     CMP14+EGFR -     Lipid panel -     TSH -     losartan (COZAAR) 50 MG tablet; Take 1 tablet (50 mg total) by mouth daily.  Gastroesophageal reflux disease without esophagitis Well controlled on current regimen.  -     omeprazole (PRILOSEC) 40 MG capsule; Take 1 capsule (40 mg total) by mouth daily.  Class 1 obesity due to excess calories with serious comorbidity and body mass index (BMI) of 32.0 to 32.9 in adult Continue diet, exercise.    Immunization History  Administered Date(s) Administered   Influenza,inj,Quad PF,6+ Mos 11/05/2016, 11/05/2017, 12/14/2019   Moderna Sars-Covid-2 Vaccination 12/24/2019   Tdap 01/02/2008, 01/02/2008, 08/15/2022    Health Maintenance  Topic Date Due   INFLUENZA VACCINE  04/01/2023 (Originally 08/02/2022)   Zoster Vaccines- Shingrix (1 of 2) 05/03/2023 (Originally 09/26/2017)   Hepatitis C Screening  02/28/2024 (Originally 09/26/1985)   HIV Screening  02/28/2024 (Originally 09/27/1982)   COVID-19 Vaccine (2 - 2024-25 season) 03/15/2024 (Originally 09/02/2022)   Fecal DNA (Cologuard)  02/26/2025   DTaP/Tdap/Td (4 - Td or Tdap) 08/14/2032   HPV VACCINES  Aged Out    Discussed health benefits of physical activity, and encouraged him to engage in regular exercise appropriate for his age and condition.  Problem List Items Addressed This Visit       Cardiovascular and Mediastinum   Hypertension   Relevant Medications   losartan (COZAAR) 50 MG tablet   Other Relevant Orders   CBC with Differential/Platelet   CMP14+EGFR   Lipid panel   TSH     Digestive   GERD (gastroesophageal  reflux disease)   Relevant Medications   omeprazole (PRILOSEC) 40 MG capsule     Other   Class 1 obesity due to excess calories with serious comorbidity and body mass index (BMI) of 32.0 to 32.9 in adult  Other Visit Diagnoses       Routine general medical examination at a health care facility    -  Primary      Return in about 2 weeks (around 03/14/2023) for nurse visit BP check, 6 month chronic follow up with me.   The patient indicates understanding of these issues and agrees with the plan.  Gabriel Earing, FNP

## 2023-02-28 NOTE — Patient Instructions (Signed)
 Health Maintenance, Male  Adopting a healthy lifestyle and getting preventive care are important in promoting health and wellness. Ask your health care provider about:  The right schedule for you to have regular tests and exams.  Things you can do on your own to prevent diseases and keep yourself healthy.  What should I know about diet, weight, and exercise?  Eat a healthy diet    Eat a diet that includes plenty of vegetables, fruits, low-fat dairy products, and lean protein.  Do not eat a lot of foods that are high in solid fats, added sugars, or sodium.  Maintain a healthy weight  Body mass index (BMI) is a measurement that can be used to identify possible weight problems. It estimates body fat based on height and weight. Your health care provider can help determine your BMI and help you achieve or maintain a healthy weight.  Get regular exercise  Get regular exercise. This is one of the most important things you can do for your health. Most adults should:  Exercise for at least 150 minutes each week. The exercise should increase your heart rate and make you sweat (moderate-intensity exercise).  Do strengthening exercises at least twice a week. This is in addition to the moderate-intensity exercise.  Spend less time sitting. Even light physical activity can be beneficial.  Watch cholesterol and blood lipids  Have your blood tested for lipids and cholesterol at 56 years of age, then have this test every 5 years.  You may need to have your cholesterol levels checked more often if:  Your lipid or cholesterol levels are high.  You are older than 56 years of age.  You are at high risk for heart disease.  What should I know about cancer screening?  Many types of cancers can be detected early and may often be prevented. Depending on your health history and family history, you may need to have cancer screening at various ages. This may include screening for:  Colorectal cancer.  Prostate cancer.  Skin cancer.  Lung  cancer.  What should I know about heart disease, diabetes, and high blood pressure?  Blood pressure and heart disease  High blood pressure causes heart disease and increases the risk of stroke. This is more likely to develop in people who have high blood pressure readings or are overweight.  Talk with your health care provider about your target blood pressure readings.  Have your blood pressure checked:  Every 3-5 years if you are 9-95 years of age.  Every year if you are 85 years old or older.  If you are between the ages of 29 and 29 and are a current or former smoker, ask your health care provider if you should have a one-time screening for abdominal aortic aneurysm (AAA).  Diabetes  Have regular diabetes screenings. This checks your fasting blood sugar level. Have the screening done:  Once every three years after age 23 if you are at a normal weight and have a low risk for diabetes.  More often and at a younger age if you are overweight or have a high risk for diabetes.  What should I know about preventing infection?  Hepatitis B  If you have a higher risk for hepatitis B, you should be screened for this virus. Talk with your health care provider to find out if you are at risk for hepatitis B infection.  Hepatitis C  Blood testing is recommended for:  Everyone born from [birth year] through 1963.  Anyone  with known risk factors for hepatitis C.  Sexually transmitted infections (STIs)  You should be screened each year for STIs, including gonorrhea and chlamydia, if:  You are sexually active and are younger than 56 years of age.  You are older than 56 years of age and your health care provider tells you that you are at risk for this type of infection.  Your sexual activity has changed since you were last screened, and you are at increased risk for chlamydia or gonorrhea. Ask your health care provider if you are at risk.  Ask your health care provider about whether you are at high risk for HIV. Your health care provider  may recommend a prescription medicine to help prevent HIV infection. If you choose to take medicine to prevent HIV, you should first get tested for HIV. You should then be tested every 3 months for as long as you are taking the medicine.  Follow these instructions at home:  Alcohol use  Do not drink alcohol if your health care provider tells you not to drink.  If you drink alcohol:  Limit how much you have to 0-2 drinks a day.  Know how much alcohol is in your drink. In the [place] one drink equals one 12 oz bottle of beer (355 mL), one 5 oz glass of wine (148 mL), or one 1 oz glass of hard liquor (44 mL).  Lifestyle  Do not use any products that contain nicotine or tobacco. These products include cigarettes, chewing tobacco, and vaping devices, such as e-cigarettes. If you need help quitting, ask your health care provider.  Do not use street drugs.  Do not share needles.  Ask your health care provider for help if you need support or information about quitting drugs.  General instructions  Schedule regular health, dental, and eye exams.  Stay current with your vaccines.  Tell your health care provider if:  You often feel depressed.  You have ever been abused or do not feel safe at home.  Summary  Adopting a healthy lifestyle and getting preventive care are important in promoting health and wellness.  Follow your health care provider's instructions about healthy diet, exercising, and getting tested or screened for diseases.  Follow your health care provider's instructions on monitoring your cholesterol and blood pressure.  This information is not intended to replace advice given to you by your health care provider. Make sure you discuss any questions you have with your health care provider.  Document Revised: 05/09/2020 Document Reviewed: 05/09/2020  Elsevier Patient Education  2024 [employer].

## 2023-02-28 NOTE — Telephone Encounter (Signed)
 READY for pickup  Copied from CRM [id number]. Topic: General - Other >> Feb 28, 2023  9:43 AM Maxwell Marion wrote: Reason for CRM: Patient was seen today and forgot to get work note. Says his wife will be by later when she gets off, around 4pm, to pick up note for him.

## 2023-03-04 LAB — HGB A1C W/O EAG: Hgb A1c MFr Bld: 5.9 % — ABNORMAL HIGH (ref 4.8–5.6)

## 2023-03-04 LAB — SPECIMEN STATUS REPORT

## 2023-03-06 ENCOUNTER — Encounter: Payer: Self-pay | Admitting: Family Medicine

## 2023-03-08 ENCOUNTER — Telehealth: Payer: Self-pay | Admitting: Family Medicine

## 2023-03-08 NOTE — Telephone Encounter (Signed)
 Copied from CRM [id number]. Topic: Appointments - Appointment Cancel/Reschedule >> Mar 08, 2023 11:43 AM Epimenio Foot F wrote: Patient is calling in because patient needs to reschedule his appointment for 03/15/23. Please follow up with patient.

## 2023-03-08 NOTE — Telephone Encounter (Signed)
LMOM for patient to call back to reschedule.

## 2023-03-14 ENCOUNTER — Ambulatory Visit: Payer: Self-pay | Admitting: Family Medicine

## 2023-03-14 ENCOUNTER — Encounter: Payer: Self-pay | Admitting: Family Medicine

## 2023-03-14 ENCOUNTER — Ambulatory Visit: Admitting: Family Medicine

## 2023-03-14 VITALS — BP 129/84 | HR 85 | Ht 69.0 in | Wt 216.0 lb

## 2023-03-14 DIAGNOSIS — K219 Gastro-esophageal reflux disease without esophagitis: Secondary | ICD-10-CM

## 2023-03-14 DIAGNOSIS — I10 Essential (primary) hypertension: Secondary | ICD-10-CM | POA: Diagnosis not present

## 2023-03-14 MED ORDER — OMEPRAZOLE 40 MG PO CPDR: 40.0000 mg | DELAYED_RELEASE_CAPSULE | Freq: Every day | ORAL | 0 refills | Status: DC

## 2023-03-14 NOTE — Progress Notes (Signed)
 BP 129/84   Pulse 85   Ht 5\' 9"  (1.753 m)   Wt 216 lb (98 kg)   SpO2 100%   BMI 31.90 kg/m    Subjective:   Patient ID: Larry Patel, male    DOB: [birth year], 58 y.o.   MRN: [id number]  HPI: Larry Patel is a 58 y.o. male presenting on 03/14/2023 for Hypertension, Abdominal Pain (Epi area. Full feeling, hard to catch breath), and blue extremities (Hands/digits)   HPI Hypertension Patient is currently on losartan, and their blood pressure today is 128/84. Patient denies any lightheadedness or dizziness. Patient denies headaches, blurred vision, chest pains, shortness of breath, or weakness. Denies any side effects from medication and is content with current medication.   Patient said he had an episode 2 days ago and epigastric pain discomfort.  He says it happened after a day that he ate a bunch of spaghetti in the evening and then 30 to 40 minutes he had this epigastric pain bloating belching burping and he has noticed some dark stools over the past couple days.  He denies any shortness of breath although he said he had abnormal breathing during the episode.  Denies any flushing or palpitations during this episode.  Relevant past medical, surgical, family and social history reviewed and updated as indicated. Interim medical history since our last visit reviewed. Allergies and medications reviewed and updated.  Review of Systems  Constitutional:  Negative for chills and fever.  Eyes:  Negative for visual disturbance.  Respiratory:  Negative for shortness of breath and wheezing.   Cardiovascular:  Positive for chest pain. Negative for leg swelling.  Gastrointestinal:  Positive for abdominal pain and constipation. Negative for blood in stool, diarrhea, nausea and vomiting.  Musculoskeletal:  Negative for back pain and gait problem.  Skin:  Negative for rash.  Neurological:  Negative for dizziness and light-headedness.  All other systems reviewed and are negative.   Per HPI unless  specifically indicated above   Allergies as of 03/14/2023   No Known Allergies      Medication List        Accurate as of March 14, 2023  2:37 PM. If you have any questions, ask your nurse or doctor.          EPINEPHrine 0.3 mg/0.3 mL Soaj injection Commonly known as: EPI-PEN Inject 0.3 mg into the muscle as needed for up to 1 dose for anaphylaxis.   fluticasone 50 MCG/ACT nasal spray Commonly known as: FLONASE Place 2 sprays into both nostrils daily.   losartan 50 MG tablet Commonly known as: COZAAR Take 1 tablet (50 mg total) by mouth daily.   multivitamin with minerals tablet Take 1 tablet by mouth daily.   omeprazole 40 MG capsule Commonly known as: PRILOSEC Take 1 capsule (40 mg total) by mouth daily.         Objective:   BP 129/84   Pulse 85   Ht 5\' 9"  (1.753 m)   Wt 216 lb (98 kg)   SpO2 100%   BMI 31.90 kg/m   Wt Readings from Last 3 Encounters:  03/14/23 216 lb (98 kg)  02/28/23 217 lb (98.4 kg)  11/23/22 217 lb 9.6 oz (98.7 kg)    Physical Exam Vitals and nursing note reviewed.  Constitutional:      General: He is not in acute distress.    Appearance: He is well-developed. He is not diaphoretic.  Eyes:     General: No scleral icterus.  Conjunctiva/sclera: Conjunctivae normal.  Neck:     Thyroid: No thyromegaly.  Cardiovascular:     Rate and Rhythm: Normal rate and regular rhythm.     Heart sounds: Normal heart sounds. No murmur heard. Pulmonary:     Effort: Pulmonary effort is normal. No respiratory distress.     Breath sounds: Normal breath sounds. No wheezing.  Abdominal:     General: Abdomen is flat. Bowel sounds are normal. There is no distension.     Palpations: Abdomen is soft.     Tenderness: There is abdominal tenderness in the epigastric area. There is no right CVA tenderness, left CVA tenderness, guarding or rebound.     Hernia: No hernia is present.  Musculoskeletal:        General: Normal range of motion.      Cervical back: Neck supple.  Lymphadenopathy:     Cervical: No cervical adenopathy.  Skin:    General: Skin is warm and dry.     Findings: No rash.  Neurological:     Mental Status: He is alert and oriented to person, place, and time.     Coordination: Coordination normal.  Psychiatric:        Behavior: Behavior normal.    EKG: Normal sinus rhythm, left axis deviation, otherwise normal   Assessment & Plan:   Problem List Items Addressed This Visit       Cardiovascular and Mediastinum   Hypertension - Primary   Relevant Orders   EKG 12-Lead     Digestive   GERD (gastroesophageal reflux disease)   Relevant Orders   EKG 12-Lead    Think epigastric pain is from uncontrolled GERD, especially since it was after he ate spaghetti with can be problematic foods for GERD.  Think with his fingers that he may have infection in Raynaud's syndrome but is not anything that is visible today so he may have to search into that later.  Recommend that he double up on the omeprazole and take 40 mg at dinnertime every day for the next 7 days  Blood pressure on recheck today is 129/84 so I would not change anything Follow up plan: Return if symptoms worsen or fail to improve.  Counseling provided for all of the vaccine components Orders Placed This Encounter  Procedures   EKG 12-Lead    Arville Care, MD [facility] 03/14/2023, 2:37 PM

## 2023-03-14 NOTE — Telephone Encounter (Signed)
 Copied from CRM [id number]. Topic: Clinical - Red Word Triage >> Mar 14, 2023 10:47 AM Larry Patel wrote: Red Word that prompted transfer to Nurse Triage: Patient called in yesterday BP 160/92 this morning 167/102   Chief Complaint: High blood pressure Symptoms: No symptoms  Frequency: 2 days of high blood pressures. Pertinent Negatives: Patient denies headache, dizziness, numbness, tingling, chest pain, shortness of breath  Disposition: [] ED /[] Urgent Care (no appt availability in office) / [x] Appointment(In office/virtual)/ []  [facility]/ [] Home Care/ [] Refused Recommended Disposition /[] [facility]/ []  Follow-up with PCP Additional Notes: Patient reports that he has had elevated blood pressures for the last couple of days. He states that yesterday his blood pressure was 160/92 and today it was 167/102. He denies any headache, dizziness, numbness, tingling, chest pain, or shortness of breath. Patient has an appointment tomorrow to have his blood pressure checked and wanted to know if there was any appointment available today with his PCP. I advised him that his PCP has no appointments today, and he will keep his appointment for tomorrow. Patient instructed to call back for new or worsening symptoms. Patient verbalized understanding and agreement with this plan.    Reason for Disposition  Systolic BP  >= 160 OR Diastolic >= 100  Answer Assessment - Initial Assessment Questions 1. BLOOD PRESSURE: "What is the blood pressure?" "Did you take at least two measurements 5 minutes apart?"     167/102 2. ONSET: "When did you take your blood pressure?"     This morning  3. HOW: "How did you take your blood pressure?" (e.g., automatic home BP monitor, visiting nurse)     Nurse at work 4. HISTORY: "Do you have a history of high blood pressure?"     Yes 5. MEDICINES: "Are you taking any medicines for blood pressure?" "Have you missed any doses recently?"     Has not missed a  dose 6. OTHER SYMPTOMS: "Do you have any symptoms?" (e.g., blurred vision, chest pain, difficulty breathing, headache, weakness)     No  Protocols used: Blood Pressure - High-A-AH

## 2023-03-14 NOTE — Telephone Encounter (Signed)
 Appt scheduled for 03/14/2023 with DOD

## 2023-03-15 ENCOUNTER — Ambulatory Visit: Payer: BC Managed Care – PPO

## 2023-04-09 ENCOUNTER — Ambulatory Visit: Admitting: Cardiology

## 2023-08-28 ENCOUNTER — Ambulatory Visit: Payer: BC Managed Care – PPO | Admitting: Family Medicine

## 2023-09-09 ENCOUNTER — Ambulatory Visit: Admitting: Family Medicine

## 2023-09-09 ENCOUNTER — Encounter: Payer: Self-pay | Admitting: Family Medicine

## 2023-09-09 VITALS — BP 152/95 | HR 86 | Temp 98.4°F | Ht 69.0 in | Wt 223.8 lb

## 2023-09-09 DIAGNOSIS — K219 Gastro-esophageal reflux disease without esophagitis: Secondary | ICD-10-CM | POA: Diagnosis not present

## 2023-09-09 DIAGNOSIS — I10 Essential (primary) hypertension: Secondary | ICD-10-CM

## 2023-09-09 DIAGNOSIS — M2550 Pain in unspecified joint: Secondary | ICD-10-CM

## 2023-09-09 DIAGNOSIS — R7303 Prediabetes: Secondary | ICD-10-CM | POA: Diagnosis not present

## 2023-09-09 DIAGNOSIS — R140 Abdominal distension (gaseous): Secondary | ICD-10-CM

## 2023-09-09 DIAGNOSIS — R635 Abnormal weight gain: Secondary | ICD-10-CM

## 2023-09-09 LAB — BAYER DCA HB A1C WAIVED: HB A1C (BAYER DCA - WAIVED): 5.4 % (ref 4.8–5.6)

## 2023-09-09 MED ORDER — OMEPRAZOLE 40 MG PO CPDR
40.0000 mg | DELAYED_RELEASE_CAPSULE | Freq: Every day | ORAL | 1 refills | Status: AC
Start: 2023-09-09 — End: ?

## 2023-09-09 MED ORDER — LOSARTAN POTASSIUM-HCTZ 50-12.5 MG PO TABS
1.0000 | ORAL_TABLET | Freq: Every day | ORAL | 3 refills | Status: AC
Start: 2023-09-09 — End: ?

## 2023-09-09 NOTE — Progress Notes (Signed)
 Established Patient Office Visit  Subjective   Patient ID: Larry Patel, male    DOB: [birth year]  Age: 58 y.o. MRN: [id number]  Chief Complaint  Patient presents with   Medical Management of Chronic Issues    HPI  History of Present Illness   Larry Patel is a 58 year old male with hypertension who presents with elevated blood pressure and joint pain.  Hypertension - Home blood pressure readings range from 130-138/80 mmHg - Takes losartan  50 mg daily  Polyarticular joint pain and stiffness - Joint pain and morning swelling in the hands, more pronounced in right hand - Decreased ROM in hands in the morning - Pain present in hands, foot, spine, shoulders, and knees, resembling arthritis - Morning stiffness and swelling improve after being awake for a while - No redness in fingers or hands - Frequently uses ibuprofen for symptom relief - Family history of arthritis in grandfather, type unspecified  Weight gain and gastrointestinal symptoms - Gained approximately 7 pounds over the past six months despite regular physical activity (running, mountain biking, gym workouts) - Sensation of bloating and fullness after eating small amounts - No acid reflux symptoms while on omeprazole  - Bowel movements regular, soft, and easy to pass - No abdominal cramping, stomach pain, or increased gas        ROS As per HPI.    Objective:     BP (!) 152/95   Pulse 86   Temp 98.4 F (36.9 C) (Temporal)   Ht 5' 9 (1.753 m)   Wt 223 lb 12.8 oz (101.5 kg)   SpO2 98%   BMI 33.05 kg/m  Wt Readings from Last 3 Encounters:  09/09/23 223 lb 12.8 oz (101.5 kg)  03/14/23 216 lb (98 kg)  02/28/23 217 lb (98.4 kg)      Physical Exam Vitals and nursing note reviewed.  Constitutional:      General: He is not in acute distress.    Appearance: Normal appearance. He is not ill-appearing, toxic-appearing or diaphoretic.  Cardiovascular:     Rate and Rhythm: Normal rate and regular rhythm.      Pulses: Normal pulses.     Heart sounds: Normal heart sounds. No murmur heard. Pulmonary:     Effort: Pulmonary effort is normal. No respiratory distress.     Breath sounds: Normal breath sounds.  Abdominal:     General: Bowel sounds are normal. There is no distension.     Palpations: Abdomen is soft. There is no mass.     Tenderness: There is no abdominal tenderness. There is no guarding or rebound.  Musculoskeletal:        General: No swelling or tenderness.     Right lower leg: No edema.     Left lower leg: No edema.  Skin:    General: Skin is warm and dry.  Neurological:     General: No focal deficit present.     Mental Status: He is alert and oriented to person, place, and time.  Psychiatric:        Mood and Affect: Mood normal.        Behavior: Behavior normal.      No results found for any visits on 09/09/23.    The 10-year ASCVD risk score (Arnett DK, et al., 2019) is: 5.5%    Assessment & Plan:   Velton was seen today for medical management of chronic issues.  Diagnoses and all orders for this visit:  Primary hypertension -  losartan -hydrochlorothiazide (HYZAAR) 50-12.5 MG tablet; Take 1 tablet by mouth daily. -     CBC with Differential/Platelet -     CMP14+EGFR  Gastroesophageal reflux disease without esophagitis -     omeprazole  (PRILOSEC) 40 MG capsule; Take 1 capsule (40 mg total) by mouth daily.  Arthralgia of multiple joints -     ANA w/Reflex if Positive -     Sedimentation Rate -     C-reactive protein  Prediabetes -     C-reactive protein -     Bayer DCA Hb A1c Waived  Weight gain -     Thyroid  Panel With TSH -     Thyroid  peroxidase antibody -     CYCLIC CITRUL PEPTIDE ANTIBODY, IGG/IGA -     Cancel: Bayer DCA Hb A1c Waived; Future -     Cancel: Bayer DCA Hb A1c Waived  Abdominal bloating      Polyarticular joint pain and morning stiffness, evaluating for inflammatory arthritis Morning stiffness and swelling in hands, pain in  multiple joints. Symptoms improve throughout the day. Family history of arthritis. Differential includes osteoarthritis and inflammatory arthritis. - Order lab work for inflammatory markers and ANA. - Consider rheumatology referral if labs suggest autoimmune arthritis. - Discussed osteoarthritis treatments: anti-inflammatories, stretching, ice, heat, rest.  Hypertension Blood pressure elevated today and minimally at home, readings 130-138/80 mmHg. Current medication is losartan  50 mg. - Prescribe losartan  with HCTZ. - Follow-up blood pressure check in two weeks with nurse.  Weight gain and possible insulin resistance, monitoring for prediabetes Weight gain despite exercise. Previous A1c at prediabetic level. Discussed insulin resistance and treatment options. Insurance does not cover Ozempic for prediabetes. - Order A1c and thyroid  panel. - Consider metformin if A1c remains in prediabetic range.  Bloating and early satiety, evaluating for gastrointestinal etiology Bloating and early satiety with regular bowel movements. No significant abdominal pain, constipation, or diarrhea. Previous Cologuard negative. - Consider GI specialist referral if symptoms persist. - Encourage food diary to identify dietary triggers.       Return in about 2 weeks (around 09/23/2023) for BP check, nurse visit.   The patient indicates understanding of these issues and agrees with the plan.  Larry Patel Search, FNP

## 2023-09-10 LAB — CMP14+EGFR
ALT: 33 IU/L (ref 0–44)
AST: 21 IU/L (ref 0–40)
Albumin: 4.3 g/dL (ref 3.8–4.9)
Alkaline Phosphatase: 80 IU/L (ref 44–121)
BUN/Creatinine Ratio: 14 (ref 9–20)
BUN: 12 mg/dL (ref 6–24)
Bilirubin Total: 0.3 mg/dL (ref 0.0–1.2)
CO2: 23 mmol/L (ref 20–29)
Calcium: 9.6 mg/dL (ref 8.7–10.2)
Chloride: 101 mmol/L (ref 96–106)
Creatinine, Ser: 0.83 mg/dL (ref 0.76–1.27)
Globulin, Total: 2.9 g/dL (ref 1.5–4.5)
Glucose: 93 mg/dL (ref 70–99)
Potassium: 5.1 mmol/L (ref 3.5–5.2)
Sodium: 140 mmol/L (ref 134–144)
Total Protein: 7.2 g/dL (ref 6.0–8.5)
eGFR: 103 mL/min/1.73 (ref >59)

## 2023-09-10 LAB — THYROID PANEL WITH TSH
Free Thyroxine Index: 1.5 (ref 1.2–4.9)
T3 Uptake Ratio: 24 % (ref 24–39)
T4, Total: 6.3 ug/dL (ref 4.5–12.0)
TSH: 2.25 u[IU]/mL (ref 0.450–4.500)

## 2023-09-10 LAB — SEDIMENTATION RATE: Sed Rate: 8 mm/h (ref 0–30)

## 2023-09-10 LAB — CBC WITH DIFFERENTIAL/PLATELET
Basophils Absolute: 0.1 x10E3/uL (ref 0.0–0.2)
Basos: 1 %
EOS (ABSOLUTE): 0.4 x10E3/uL (ref 0.0–0.4)
Eos: 5 %
Hematocrit: 45.7 % (ref 37.5–51.0)
Hemoglobin: 14.9 g/dL (ref 13.0–17.7)
Immature Grans (Abs): 0 x10E3/uL (ref 0.0–0.1)
Immature Granulocytes: 0 %
Lymphocytes Absolute: 2.4 x10E3/uL (ref 0.7–3.1)
Lymphs: 28 %
MCH: 31 pg (ref 26.6–33.0)
MCHC: 32.6 g/dL (ref 31.5–35.7)
MCV: 95 fL (ref 79–97)
Monocytes Absolute: 0.6 x10E3/uL (ref 0.1–0.9)
Monocytes: 7 %
Neutrophils Absolute: 5.3 x10E3/uL (ref 1.4–7.0)
Neutrophils: 59 %
Platelets: 270 x10E3/uL (ref 150–450)
RBC: 4.8 x10E6/uL (ref 4.14–5.80)
RDW: 13.3 % (ref 11.6–15.4)
WBC: 8.8 x10E3/uL (ref 3.4–10.8)

## 2023-09-10 LAB — C-REACTIVE PROTEIN: CRP: 10 mg/L (ref 0–10)

## 2023-09-10 LAB — ANA W/REFLEX IF POSITIVE: Anti Nuclear Antibody (ANA): NEGATIVE

## 2023-09-10 LAB — THYROID PEROXIDASE ANTIBODY: Thyroperoxidase Ab SerPl-aCnc: 11 [IU]/mL (ref 0–34)

## 2023-09-10 LAB — CYCLIC CITRUL PEPTIDE ANTIBODY, IGG/IGA: Cyclic Citrullin Peptide Ab: 9 U (ref 0–19)

## 2023-09-11 ENCOUNTER — Ambulatory Visit: Payer: Self-pay | Admitting: Family Medicine

## 2023-09-23 ENCOUNTER — Ambulatory Visit (INDEPENDENT_AMBULATORY_CARE_PROVIDER_SITE_OTHER): Admitting: *Deleted

## 2023-09-23 VITALS — BP 131/82 | HR 83

## 2023-09-23 DIAGNOSIS — I10 Essential (primary) hypertension: Secondary | ICD-10-CM

## 2023-09-23 NOTE — Progress Notes (Signed)
 Patient is in office today for a nurse visit for Blood Pressure Check. Patients blood pressure is 131/82.

## 2023-10-01 NOTE — Progress Notes (Signed)
 Left detailed message for patient to call office and make a 3 mth BP follow up with PCP (schedule in dec).

## 2024-03-09 ENCOUNTER — Ambulatory Visit: Payer: Self-pay | Admitting: Family Medicine
